# Patient Record
Sex: Female | Born: 1935 | Race: White | Hispanic: No | Smoking: Never smoker
Health system: Southern US, Community
[De-identification: ages and names within clinical notes are randomized; demographics above are authoritative.]

## PROBLEM LIST (undated history)

## (undated) DIAGNOSIS — E119 Type 2 diabetes mellitus without complications: Secondary | ICD-10-CM

## (undated) DIAGNOSIS — K635 Polyp of colon: Secondary | ICD-10-CM

## (undated) DIAGNOSIS — I10 Essential (primary) hypertension: Secondary | ICD-10-CM

## (undated) DIAGNOSIS — K649 Unspecified hemorrhoids: Secondary | ICD-10-CM

## (undated) DIAGNOSIS — G4700 Insomnia, unspecified: Secondary | ICD-10-CM

## (undated) DIAGNOSIS — E785 Hyperlipidemia, unspecified: Secondary | ICD-10-CM

## (undated) HISTORY — PX: TOTAL ANKLE ARTHROPLASTY: SHX811

## (undated) HISTORY — DX: Hyperlipidemia, unspecified: E78.5

## (undated) HISTORY — DX: Essential (primary) hypertension: I10

## (undated) HISTORY — DX: Unspecified hemorrhoids: K64.9

## (undated) HISTORY — DX: Polyp of colon: K63.5

## (undated) HISTORY — PX: APPENDECTOMY: SHX54

## (undated) HISTORY — DX: Type 2 diabetes mellitus without complications: E11.9

## (undated) HISTORY — DX: Insomnia, unspecified: G47.00

## (undated) HISTORY — PX: LUNG SURGERY: SHX703

## (undated) HISTORY — PX: TONSILLECTOMY: SUR1361

## (undated) HISTORY — PX: CHOLECYSTECTOMY: SHX55

---

## 2012-05-13 HISTORY — PX: COLONOSCOPY: SHX174

## 2013-03-30 ENCOUNTER — Ambulatory Visit: Payer: Self-pay

## 2013-05-24 ENCOUNTER — Ambulatory Visit: Payer: Self-pay | Admitting: Gastroenterology

## 2013-07-12 ENCOUNTER — Ambulatory Visit: Payer: Self-pay | Admitting: Physical Medicine and Rehabilitation

## 2013-09-15 ENCOUNTER — Emergency Department: Payer: Self-pay | Admitting: Emergency Medicine

## 2014-08-25 ENCOUNTER — Ambulatory Visit
Admit: 2014-08-25 | Disposition: A | Discharge status: Home / Self Care | Payer: Self-pay | Attending: Infectious Diseases | Admitting: Infectious Diseases

## 2015-01-27 ENCOUNTER — Other Ambulatory Visit: Payer: Self-pay | Admitting: Infectious Diseases

## 2015-01-27 DIAGNOSIS — Z1231 Encounter for screening mammogram for malignant neoplasm of breast: Secondary | ICD-10-CM

## 2015-01-30 ENCOUNTER — Ambulatory Visit
Admission: RE | Admit: 2015-01-30 | Discharge: 2015-01-30 | Disposition: A | Discharge status: Home / Self Care | Payer: Medicare PPO | Source: Ambulatory Visit | Attending: Infectious Diseases | Admitting: Infectious Diseases

## 2015-01-30 ENCOUNTER — Other Ambulatory Visit: Payer: Self-pay | Admitting: Infectious Diseases

## 2015-01-30 DIAGNOSIS — Z1231 Encounter for screening mammogram for malignant neoplasm of breast: Secondary | ICD-10-CM | POA: Insufficient documentation

## 2015-04-21 ENCOUNTER — Encounter: Payer: Medicare PPO | Attending: Infectious Diseases | Admitting: *Deleted

## 2015-04-21 ENCOUNTER — Encounter: Payer: Self-pay | Admitting: *Deleted

## 2015-04-21 VITALS — BP 114/80 | Ht 63.0 in | Wt 151.4 lb

## 2015-04-21 DIAGNOSIS — E119 Type 2 diabetes mellitus without complications: Secondary | ICD-10-CM | POA: Diagnosis not present

## 2015-04-21 NOTE — Progress Notes (Signed)
Diabetes Self-Management Education  Visit Type: First/Initial  Appt. Start Time: 1110 Appt. End Time: 1230  04/21/2015  [name], identified by name and date of birth, is a 79 y.o. female with a diagnosis of Diabetes: Type 2.   ASSESSMENT  Blood pressure 114/80, height 5\' 3"  (1.6 m), weight 151 lb 6.4 oz (68.675 kg). Body mass index is 26.83 kg/(m^2).      Diabetes Self-Management Education - 04/21/15 1424    Visit Information   Visit Type First/Initial   Initial Visit   Diabetes Type Type 2   Are you currently following a meal plan? No   Are you taking your medications as prescribed? Yes   Date Diagnosed last month   Health Coping   How would you rate your overall health? Good   Psychosocial Assessment   Patient Belief/Attitude about Diabetes Other (comment)  "No emotions"   Self-care barriers None   Self-management support Doctor's office;Family   Patient Concerns Nutrition/Meal planning;Monitoring;Glycemic Control;Problem Solving;Healthy Lifestyle;Weight Control   Special Needs None   Preferred Learning Style Auditory;Visual;Hands on   Learning Readiness Ready   How often do you need to have someone help you when you read instructions, pamphlets, or other written materials from your doctor or pharmacy? 1 - Never   What is the last grade level you completed in school? 12th   Complications   Last HgB A1C per patient/outside source 6.6 %  03/23/15   How often do you check your blood sugar? 0 times/day (not testing)  Provided One Touch Ultra Mini meter and instructed on use. BG upon return demonstration was 115 mg/dL at 95:6212:20 pm - 3 hrs pp.    Have you had a dilated eye exam in the past 12 months? No   Have you had a dental exam in the past 12 months? Yes   Are you checking your feet? No   Dietary Intake   Breakfast cereal and milk   Lunch fast food, weight watchers pizza   Dinner take home from lunch   Beverage(s) water, diet soda   Exercise   Exercise  Type Moderate (swimming / aerobic walking)   How many days per week to you exercise? 5   How many minutes per day do you exercise? 60   Total minutes per week of exercise 300   Patient Education   Previous Diabetes Education No   Disease state  Definition of diabetes, type 1 and 2, and the diagnosis of diabetes;Factors that contribute to the development of diabetes   Nutrition management  Role of diet in the treatment of diabetes and the relationship between the three main macronutrients and blood glucose level;Carbohydrate counting   Physical activity and exercise  Role of exercise on diabetes management, blood pressure control and cardiac health.   Monitoring Taught/evaluated SMBG meter.;Purpose and frequency of SMBG.;Identified appropriate SMBG and/or A1C goals.   Chronic complications Relationship between chronic complications and blood glucose control   Psychosocial adjustment Role of stress on diabetes   Individualized Goals (developed by patient)   Reducing Risk Improve blood sugars Prevent diabetes complications Lose weight Lead a healthier lifestyle Become more fit   Outcomes   Expected Outcomes Demonstrated interest in learning. Expect positive outcomes      Individualized Plan for Diabetes Self-Management Training:   Learning Objective:  Patient will have a greater understanding of diabetes self-management. Patient education plan is to attend individual and/or group sessions per assessed needs and concerns.   Plan:   Patient  Instructions  Check blood sugars 2 x day before breakfast and 2 hrs after supper 3 x week Exercise: Continue program for  60  minutes  5  days a week Eat 3 meals day,  1 snack a day Space meals 4-6 hours apart Don't skip meals Limit desserts/sweets Bring blood sugar records to the next class Call your doctor for a prescription for:  1. Meter strips (type) One Touch Ultra Blue checking  3  times per week  2. Lancets (type) One Touch  Delica checking  3  times per week  Expected Outcomes:  Demonstrated interest in learning. Expect positive outcomes  Education material provided:  General Meal Planning Guidelines Simple Meal Plan Meter - One Touch Ultra Mini  If problems or questions, patient to contact team via:   Sharion Settler, RN, CCM, CDE [phone]  Future DSME appointment:  Thursday May 11, 2015 for Class 1

## 2015-04-21 NOTE — Patient Instructions (Addendum)
Check blood sugars 2 x day before breakfast and 2 hrs after supper 3 x week Exercise: Continue program for  60  minutes  5  days a week Eat 3 meals day,  1 snack a day Space meals 4-6 hours apart Don't skip meals Limit desserts/sweets Bring blood sugar records to the next class Call your doctor for a prescription for:  1. Meter strips (type) One Touch Ultra Blue checking  3  times per week  2. Lancets (type) One Touch Delica checking  3  times per week

## 2015-05-11 ENCOUNTER — Encounter: Payer: Medicare PPO | Admitting: Dietician

## 2015-05-11 ENCOUNTER — Encounter: Payer: Self-pay | Admitting: Dietician

## 2015-05-11 VITALS — Wt 151.0 lb

## 2015-05-11 DIAGNOSIS — E119 Type 2 diabetes mellitus without complications: Secondary | ICD-10-CM

## 2015-05-11 NOTE — Patient Instructions (Signed)
Appt. Start Time: 9:00am Appt. End Time: 12:00 pm  Class 1 Diabetes Overview - define DM; state own type of DM; identify functions of pancreas and insulin; define insulin deficiency vs insulin resistance  Psychosocial - identify DM as a source of stress; state the effects of stress on BG control; verbalize appropriate stress management techniques; identify personal stress issues   Nutritional Management - describe effects of food on blood glucose; identify sources of carbohydrate, protein and fat; verbalize the importance of balance meals in controlling blood glucose; identify meals as well balanced or not; estimate servings of carbohydrate from menus; use food labels to identify servings size, content of carbohydrate, fiber, protein, fat, saturated fat and sodium; recognize food sources of fat, saturated fat, trans fat, sodium and verbalize goals for intake; describe healthful appropriate food choices when dining out   Exercise - describe the effects of exercise on blood glucose and importance of regular exercise in controlling diabetes; state a plan for personal exercise; verbalize contraindications for exercise  Self-Monitoring - state importance of HBGM and demo procedure accurately; use HBGM results to effectively manage diabetes; identify importance of regular HbA1C testing and goals for results  Acute Complications/Sick Day Guidelines - recognize hyperglycemia and hypoglycemia with causes and effects; identify blood glucose results as high, low or in control; list steps in treating and preventing high and low blood glucose; state appropriate measure to manage blood glucose when ill (need for meds, HBGM plan, when to call physician, need for fluids)  Chronic Complications/Foot, Skin, Eye Dental Care - identify possible long-term complications of diabetes (retinopathy, neuropathy, nephropathy, cardiovascular disease, infections); explain steps in prevention and treatment of chronic complications;  state importance of daily self-foot exams; describe how to examine feet and what to look for; explain appropriate eye and dental care  Lifestyle Changes/Goals & Health/Community Resources - state benefits of making appropriate lifestyle changes; identify habits that need to change (meals, tobacco, alcohol); identify strategies to reduce risk factors for personal health; set goals for proper diabetes care; state need for and frequency of healthcare follow-up; describe appropriate community resources for good health (ADA, web sites, apps)   Pregnancy/Sexual Health - define gestational diabetes; state importance of good blood glucose control and birth control prior to pregnancy; state importance of good blood glucose control in preventing sexual problems (impotence, vaginal dryness, infections, loss of desire); state relationship of blood glucose control and pregnancy outcome; describe risk of maternal and fetal complications  Teaching Materials Used: Class 1 Slides/Notebook Diabetes Booklet ID Card  Medic Alert/Medic ID Forms Sleep Evaluation Exercise Handout Daily Food Record Planning a Balanced Meal Goals for Class 1 

## 2015-05-18 ENCOUNTER — Encounter: Payer: Medicare Other | Attending: Infectious Diseases | Admitting: *Deleted

## 2015-05-18 ENCOUNTER — Encounter: Payer: Self-pay | Admitting: *Deleted

## 2015-05-18 VITALS — Wt 150.4 lb

## 2015-05-18 DIAGNOSIS — E119 Type 2 diabetes mellitus without complications: Secondary | ICD-10-CM | POA: Insufficient documentation

## 2015-05-18 NOTE — Progress Notes (Signed)

## 2015-05-25 ENCOUNTER — Encounter: Payer: Medicare Other | Admitting: Dietician

## 2015-05-25 ENCOUNTER — Encounter: Payer: Self-pay | Admitting: Dietician

## 2015-05-25 VITALS — BP 126/74 | Ht 63.0 in | Wt 151.0 lb

## 2015-05-25 DIAGNOSIS — E119 Type 2 diabetes mellitus without complications: Secondary | ICD-10-CM

## 2015-05-25 NOTE — Progress Notes (Signed)

## 2015-06-23 ENCOUNTER — Encounter: Payer: Self-pay | Admitting: *Deleted

## 2016-02-02 ENCOUNTER — Other Ambulatory Visit: Payer: Self-pay | Admitting: Physical Medicine and Rehabilitation

## 2016-02-02 DIAGNOSIS — M5416 Radiculopathy, lumbar region: Secondary | ICD-10-CM

## 2016-02-13 ENCOUNTER — Ambulatory Visit
Admission: RE | Admit: 2016-02-13 | Discharge: 2016-02-13 | Disposition: A | Discharge status: Home / Self Care | Payer: Medicare Other | Source: Ambulatory Visit | Attending: Physical Medicine and Rehabilitation | Admitting: Physical Medicine and Rehabilitation

## 2016-02-13 DIAGNOSIS — M5416 Radiculopathy, lumbar region: Secondary | ICD-10-CM | POA: Insufficient documentation

## 2016-02-13 DIAGNOSIS — M48061 Spinal stenosis, lumbar region without neurogenic claudication: Secondary | ICD-10-CM | POA: Diagnosis not present

## 2016-02-13 DIAGNOSIS — M47896 Other spondylosis, lumbar region: Secondary | ICD-10-CM | POA: Diagnosis not present

## 2016-02-13 DIAGNOSIS — M5126 Other intervertebral disc displacement, lumbar region: Secondary | ICD-10-CM | POA: Insufficient documentation

## 2017-06-25 ENCOUNTER — Telehealth: Payer: Self-pay | Admitting: General Surgery

## 2017-06-25 ENCOUNTER — Encounter: Payer: Self-pay | Admitting: General Surgery

## 2017-06-25 NOTE — Telephone Encounter (Signed)
I HAVE CALLED & L/M FOR PATIENT TO CALL & SCHEDULE AN APPOINTMENT WITH DR BYRNETT FOR RECTAL PAIN & HEMORRHOIDS,REF'D BY DR DAVID Sampson GoonFITZGERALD.

## 2017-07-09 ENCOUNTER — Encounter: Payer: Self-pay | Admitting: *Deleted

## 2017-07-15 ENCOUNTER — Ambulatory Visit: Payer: Medicare Other | Admitting: General Surgery

## 2017-07-15 ENCOUNTER — Encounter: Payer: Self-pay | Admitting: General Surgery

## 2017-07-15 VITALS — BP 120/78 | HR 108 | Resp 16 | Ht 61.0 in | Wt 143.0 lb

## 2017-07-15 DIAGNOSIS — K6289 Other specified diseases of anus and rectum: Secondary | ICD-10-CM

## 2017-07-15 NOTE — Patient Instructions (Signed)
Encouraged daily use of Fiber supplements and stool softeners.

## 2017-07-15 NOTE — Progress Notes (Signed)
Dictation #1 [id number]  [id number] Patient ID: Yvette Elliott, female   DOB: 1936-03-04, 82 y.o.   MRN: [id number]  Chief Complaint  Patient presents with  . Rectal Problems    HPI Yvette Elliott is a 82 y.o. female.  Here today for evaluation of hemorrhoids and constipation referred by Dr Sampson Goon. Bowels move daily with the use of metamucil and stool softener. She has always been prone to constipation and has even tried linzess in the past. She states she does not have rectal pain until she has a BM then she has pain the rest of the day. She does admit to having rectal pain later in the day even without a BM.  The patient had made use of Linzess for constipation.  She reported this produced unacceptable loose stools with urgency, even when decreased to an every other day dose.    The patient has been making use of a fiber supplement for the last 6 weeks without any significant improvement in her perianal/perineal pain, but fairly significant improvement in the regularity of her bowel function.  When she gets the urge to have a BM she still has to strain. No abdominal pain and no bleeding.   She did see Dr Malissa Hippo and was not pleased.  Son is in [place] coming off a vent after open heart surgery for leaking valve.  HPI  Past Medical History:  Diagnosis Date  . Colon polyp   . Diabetes mellitus without complication (HCC)   . Hemorrhoids   . Hyperlipidemia   . Hypertension   . Insomnia     Past Surgical History:  Procedure Laterality Date  . APPENDECTOMY    . CHOLECYSTECTOMY    . COLONOSCOPY  2014  . LUNG SURGERY     Removal of scar tissue  . TONSILLECTOMY    . TOTAL ANKLE ARTHROPLASTY Right     Family History  Problem Relation Age of Onset  . Leukemia Sister 81  . Diabetes Sister   . Diabetes Mother   . Diabetes Father   . Diabetes Brother   . Diabetes Maternal Grandfather   . Diabetes Paternal Grandmother   . Diabetes Brother   . Breast cancer  Maternal Grandmother 29    Social History Social History   Tobacco Use  . Smoking status: Never Smoker  . Smokeless tobacco: Never Used  Substance Use Topics  . Alcohol use: No    Alcohol/week: 0.0 oz  . Drug use: No    Allergies  Allergen Reactions  . Gemfibrozil     Questionable  . Iodine Hives and Itching    IVP dye  . Tylenol [Acetaminophen]     Issue with liver  . Aleve [Naproxen Sodium] Rash  . Ibuprofen Rash    States she can take advil    Current Outpatient Medications  Medication Sig Dispense Refill  . docusate sodium (COLACE) 100 MG capsule Take 100 mg by mouth daily as needed for mild constipation.    . hydrochlorothiazide (HYDRODIURIL) 12.5 MG tablet TAKE ONE TABLET BY MOUTH THREE TIMES A WEEK    . lisinopril (PRINIVIL,ZESTRIL) 40 MG tablet Take 40 mg by mouth daily.    Marland Kitchen LORazepam (ATIVAN) 0.5 MG tablet Take 0.5 mg by mouth daily as needed.    . Multiple Vitamin (MULTI-VITAMINS) TABS Take 1 tablet by mouth daily.    Marland Kitchen omeprazole (PRILOSEC) 20 MG capsule Take 20 mg by mouth daily as needed.    . psyllium (METAMUCIL) 58.6 %  packet Take 1 packet by mouth daily.    . rosuvastatin (CRESTOR) 10 MG tablet Take 10 mg by mouth daily.    Marland Kitchen. zolpidem (AMBIEN CR) 12.5 MG CR tablet Take 12.5 mg by mouth at bedtime as needed. for sleep  3   No current facility-administered medications for this visit.     Review of Systems Review of Systems  Constitutional: Negative.   Respiratory: Negative.   Cardiovascular: Negative.     Blood pressure 120/78, pulse (!) 108, resp. rate 16, height 5\' 1"  (1.549 m), weight 143 lb (64.9 kg), SpO2 96 %.  Physical Exam Physical Exam  Constitutional: She is oriented to person, place, and time. She appears well-developed and well-nourished.  HENT:  Mouth/Throat: Oropharynx is clear and moist.  Eyes: Conjunctivae are normal. No scleral icterus.  Neck: Neck supple.  Cardiovascular: Normal rate, regular rhythm and normal heart sounds.   Pulmonary/Chest: Effort normal and breath sounds normal.  Genitourinary: Rectal exam shows no external hemorrhoid and no fissure.  Lymphadenopathy:    She has no cervical adenopathy.  Neurological: She is alert and oriented to person, place, and time.  Skin: Skin is warm and dry.  Psychiatric: Her behavior is normal.    Data Reviewed Anoscopy showed minimal internal hemorrhoids.  No prolapse.  No bleeding.  No ulceration.  No visualized lower rectal lesions.  Sphincter tone was normal.  No palpable masses in the tissue adjacent to the rectum.  No particular spasm of the pelvic floor musculature noted.  Colonoscopy of May 24, 2013 completed by Dow AdolphMatthew Rein, MD reviewed.  Normal study.  Recommended no additional follow-ups in spite of patient's family history of colon cancer.  Did described nonthrombosed external hemorrhoids at the time of that exam.  PCP notes of June 25, 2017 reviewed.  June 02, 2017 general surgery assessment by Renda RollsWilton Smith, MD reviewed.  Assessment    No clear-cut explanation for the patient's report of discomfort in the pelvis, especially in the perianal area in the evening.  No evidence of internal or external hemorrhoids, anal fissure or fistula.    Plan    Encouraged daily use of Fiber supplements and stool softeners.  The case was reviewed with the GYN service after the patient had left the office.  Recommendation for referral to the physical therapy department for assessment of pelvic floor pathology.        HPI, Physical Exam, Assessment and Plan have been scribed under the direction and in the presence of Earline MayotteJeffrey W. Sayre Mazor, MD. Dorathy DaftMarsha Hatch, RN  I have completed the exam and reviewed the above documentation for accuracy and completeness.  I agree with the above.  [employer] has been used and any errors in dictation or transcription are unintentional.  Donnalee CurryJeffrey Amy Gothard, M.D., F.A.C.S.   Merrily PewJeffrey W Taria Castrillo 07/16/2017, 8:09  PM

## 2017-07-16 DIAGNOSIS — K6289 Other specified diseases of anus and rectum: Secondary | ICD-10-CM | POA: Insufficient documentation

## 2017-07-17 ENCOUNTER — Telehealth: Payer: Self-pay | Admitting: General Surgery

## 2017-07-17 ENCOUNTER — Other Ambulatory Visit: Payer: Self-pay

## 2017-07-17 DIAGNOSIS — R102 Pelvic and perineal pain: Secondary | ICD-10-CM

## 2017-07-17 NOTE — Telephone Encounter (Signed)
The patient's symptoms and exam had been presented to the GYN service for their input.  Recommendation to consider referral to physical therapy for pelvic floor exercises.  This information was relayed to the patient.  She is amenable to meet with the physical therapy staff for an initial assessment.

## 2017-07-20 ENCOUNTER — Encounter: Payer: Self-pay | Admitting: General Surgery

## 2017-08-08 ENCOUNTER — Encounter: Payer: Self-pay | Admitting: Physical Therapy

## 2017-08-08 ENCOUNTER — Ambulatory Visit: Payer: Medicare Other | Attending: General Surgery | Admitting: Physical Therapy

## 2017-08-08 DIAGNOSIS — M533 Sacrococcygeal disorders, not elsewhere classified: Secondary | ICD-10-CM | POA: Insufficient documentation

## 2017-08-08 DIAGNOSIS — M6281 Muscle weakness (generalized): Secondary | ICD-10-CM | POA: Insufficient documentation

## 2017-08-08 DIAGNOSIS — M25571 Pain in right ankle and joints of right foot: Secondary | ICD-10-CM | POA: Diagnosis present

## 2017-08-08 DIAGNOSIS — M79601 Pain in right arm: Secondary | ICD-10-CM | POA: Diagnosis present

## 2017-08-08 DIAGNOSIS — R2689 Other abnormalities of gait and mobility: Secondary | ICD-10-CM | POA: Diagnosis present

## 2017-08-08 DIAGNOSIS — M2170 Unequal limb length (acquired), unspecified site: Secondary | ICD-10-CM | POA: Diagnosis present
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
## 2017-08-11 NOTE — Patient Instructions (Signed)
Wear shoe lift

## 2017-08-11 NOTE — Addendum Note (Signed)
Addended by: Mariane MastersYEUNG, SHIN-YIING on: 08/11/2017 12:50 PM   Modules accepted: Orders

## 2017-08-12 ENCOUNTER — Ambulatory Visit: Payer: Medicare Other | Attending: General Surgery | Admitting: Physical Therapy

## 2017-08-12 DIAGNOSIS — R2689 Other abnormalities of gait and mobility: Secondary | ICD-10-CM | POA: Insufficient documentation

## 2017-08-12 DIAGNOSIS — M79601 Pain in right arm: Secondary | ICD-10-CM

## 2017-08-12 DIAGNOSIS — M2170 Unequal limb length (acquired), unspecified site: Secondary | ICD-10-CM | POA: Insufficient documentation

## 2017-08-12 DIAGNOSIS — M25571 Pain in right ankle and joints of right foot: Secondary | ICD-10-CM | POA: Diagnosis present

## 2017-08-12 DIAGNOSIS — M533 Sacrococcygeal disorders, not elsewhere classified: Secondary | ICD-10-CM | POA: Insufficient documentation

## 2017-08-12 DIAGNOSIS — M6281 Muscle weakness (generalized): Secondary | ICD-10-CM | POA: Diagnosis present

## 2017-08-18 ENCOUNTER — Other Ambulatory Visit: Payer: Self-pay | Admitting: Physical Medicine and Rehabilitation

## 2017-08-18 DIAGNOSIS — M5416 Radiculopathy, lumbar region: Secondary | ICD-10-CM

## 2017-08-19 ENCOUNTER — Ambulatory Visit: Payer: Medicare Other | Admitting: Physical Therapy

## 2017-08-19 ENCOUNTER — Ambulatory Visit
Admission: RE | Admit: 2017-08-19 | Discharge: 2017-08-19 | Disposition: A | Discharge status: Home / Self Care | Payer: Medicare Other | Source: Ambulatory Visit | Attending: Physical Medicine and Rehabilitation | Admitting: Physical Medicine and Rehabilitation

## 2017-08-19 DIAGNOSIS — M48061 Spinal stenosis, lumbar region without neurogenic claudication: Secondary | ICD-10-CM | POA: Diagnosis not present

## 2017-08-19 DIAGNOSIS — M47816 Spondylosis without myelopathy or radiculopathy, lumbar region: Secondary | ICD-10-CM | POA: Diagnosis not present

## 2017-08-19 DIAGNOSIS — M5416 Radiculopathy, lumbar region: Secondary | ICD-10-CM

## 2017-08-19 DIAGNOSIS — M419 Scoliosis, unspecified: Secondary | ICD-10-CM | POA: Diagnosis not present

## 2017-08-20 ENCOUNTER — Other Ambulatory Visit: Payer: Self-pay | Admitting: Physical Medicine and Rehabilitation

## 2017-08-20 DIAGNOSIS — M5412 Radiculopathy, cervical region: Secondary | ICD-10-CM

## 2017-08-21 ENCOUNTER — Telehealth: Payer: Self-pay | Admitting: Physical Therapy

## 2017-08-21 NOTE — Patient Instructions (Addendum)
Open book  Alternating freestyle with back stroke/ breast stroke to stabilize shoulder   Body scan / relaxation training to decrease upper shoulders and improve forward head posture  Education about pain science    Sleeping with bottom shoulder further back in line with other shoulder ( less fetal position), pillow between legs.

## 2017-08-21 NOTE — Telephone Encounter (Signed)
PT called pt to check in with her cancellation of future appts. Pt stated she has followed the recommendations from her past 2 visits ( wearing of shoe lift to account for scoliosis/leg length difference, changing her sleeping pillows). Pt is seeking care with Dr. Yves Dillhasnis ( Pyschiatrist)   to help with nerve pain. Pt felt overwhelmed with her medical issues and needed to simplify her Tx. PT explained her customized POC and how PT can be helpful in combination with Dr. Margaretann Lovelesshasnis's treatments and to call back to schedule more PT when she is ready to resume. Pt voiced understanding and expressed interest.  PT discussed with pt that PT will email her scheduling appt phone # and that PT plans to communicate with Dr. Yves Dillhasnis.    PT left message with Dr. Yves Dillhasnis to provide recommendation when pt would be appropriate to resume PT.

## 2017-08-21 NOTE — Therapy (Addendum)
[facility] MAIN [facility] SERVICES [address] [place], [place], [address] Phone: [phone]   Fax:  [phone]  Physical Therapy Treatment  Patient Details  Name: Yvette Elliott MRN: [id number] Date of Birth: [birth year] Referring Provider: Lemar Livings   Encounter Date: 08/12/2017    Past Medical History:  Diagnosis Date  . Colon polyp   . Diabetes mellitus without complication (HCC)   . Hemorrhoids   . Hyperlipidemia   . Hypertension   . Insomnia     Past Surgical History:  Procedure Laterality Date  . APPENDECTOMY    . CHOLECYSTECTOMY    . COLONOSCOPY  2014  . LUNG SURGERY     Removal of scar tissue  . TONSILLECTOMY    . TOTAL ANKLE ARTHROPLASTY Right     There were no vitals filed for this visit.  Subjective Assessment - 08/21/17 1102    Subjective  Pt reports she was able to walk more stable with her dogs and did not need to use her cane    Pertinent History  See above.  Pt states all of these pain in different body parts have taken her down psychologically. Pt is able to do everything she wants to do physically but she has to push herself.  Pt is no longer wanting to do her housework because of her mindset.  Pt feels frustrated with doctors and had to get second opinions about her rectal pain.  The second doctor confirmed that it is not hemorrhoids and verified it was a pelvic floor issue. Pt currently has dental issues and has been passed along different dentist.           [facility] PT Assessment - 08/21/17 1107      Palpation   SI assessment   iliac crest levelled     Palpation comment  increased paraspinal mm tensions at thoracic /medial scap area , R scalenes, and  above R lumbar convex curve                    [facility] Adult PT Treatment/Exercise - 08/21/17 1110      Neuro Re-ed    Neuro Re-ed Details   see pt instructions, relaxation training      Manual Therapy   Manual therapy comments  STM and MWM w/ trunk  rotation open book to release thoracic paraspinal, upper trap, medial aspect of shoulders              PT Education - 08/21/17 1113    Education provided  Yes    Education Details  HEP    Person(s) Educated  Patient    Methods  Explanation;Demonstration;Tactile cues;Verbal cues;Handout    Comprehension  Returned demonstration;Verbalized understanding          PT Long Term Goals - 08/11/17 1223      PT LONG TERM GOAL #1   Title  Pt will decrease her Quick Dash score from 28% to < 18% in order to restore function of her R arm and  to sleep on her R side     Time  8    Period  Weeks    Status  New    Target Date  10/06/17      PT LONG TERM GOAL #2   Title  Pt will increase her PSFS score for sleeping from 2/10 to > 8/10 in order to sleep and improve QOL    Time  4    Period  Weeks  Status  New    Target Date  10/06/17      PT LONG TERM GOAL #3   Title  Pt will decrease her LEFS score from 60/79 pts ( 76pt% ) to > 75 / 79 pts (  94% )   in order to walk a mile and do household chores.     Time  12    Period  Weeks    Status  New    Target Date  11/03/17      PT LONG TERM GOAL #4   Title  Pt will demo increased gait speed from 0.7 m/s to 1.3 m/s with less heel striking in order to progress towards walking longer distances    Time  6    Period  Weeks    Status  New    Target Date  09/22/17      PT LONG TERM GOAL #5   Title  Pt will demo IND with thoracolumbar strengthening HEP to minimize hyperflexibility of shoulder girdle as a swimmer and to minimize risk for injuries    Time  10    Period  Weeks    Status  New    Target Date  10/20/17            Plan - 08/21/17 1114    Clinical Impression Statement Pt showed good carry over with a more levelled pelvic alignment and smoother gait pattern with shoe lift in L shoe to account for leg length difference. Addressed scoliosis with manual Tx and HEP to improve forward head/overuse of upper trap/ increase  scapular stabilization to help pt achieve goal to be able to sleep on R side with less R arm pain.  Focused on sleeping modifications today in order to optimize sleep quality for improved healing process.  Planning future Tx to address scoliosis further will help with rectal/ CLBP pain/ UE complaints.   Pt requires biopsychosocial approach as she feels frustrated about her pain occurring all different parts. Initiated pain science education and mindfulness/ relaxation training.   Pt continues to perform her fitness exercises with regular routine of swimming , yoga, walking. Educated pt to perform less freestyle stroke (which she performs with repeated R sided head turns for breathing) and to alternative more with back stroke to stabilize her scapula and minimize excessive turn to her head.  No changes to numbness / tingling in her hand despite neural glides. Pt plans to communicate with doctor for neck imaging to create appropriate plan of Tx for neck/shoulders in order to help improve quality of sleep by being able to sleep on her R side. Pt continues to benefit from skilled PT.    Rehab Potential  Good    PT Frequency  1x / week    PT Duration  12 weeks    PT Treatment/Interventions  Neuromuscular re-education;Gait training;Stair training;Functional mobility training;Therapeutic activities;Therapeutic exercise;Manual techniques;Patient/family education;Scar mobilization;Moist Heat;Aquatic Therapy;Taping;Orthotic Fit/Training    Consulted and Agree with Plan of Care  Patient       Patient will benefit from skilled therapeutic intervention in order to improve the following deficits and impairments:  Abnormal gait, Decreased endurance, Decreased scar mobility, Hypomobility, Decreased activity tolerance, Increased fascial restricitons, Decreased strength, Decreased mobility, Difficulty walking, Increased muscle spasms, Decreased range of motion, Decreased safety awareness, Decreased coordination, Postural  dysfunction, Improper body mechanics, Pain  Visit Diagnosis: Leg length difference, acquired  Other abnormalities of gait and mobility  Sacrococcygeal disorders, not elsewhere classified  Muscle weakness (generalized)  Pain in right ankle and joints of right foot  Pain in right arm     Problem List Patient Active Problem List   Diagnosis Date Noted  . Rectal pain 07/16/2017    Mariane MastersYeung,Shin Yiing ,PT, DPT, E-RYT  t 08/21/2017, 11:15 AM  [facility] [address] [place], [place], [address] Phone: [phone]   Fax:  [phone]  Name: Yvette Elliott MRN: [id number] Date of Birth: 12/27/1935

## 2017-08-26 ENCOUNTER — Ambulatory Visit: Payer: Medicare Other | Admitting: Physical Therapy

## 2017-08-27 ENCOUNTER — Ambulatory Visit
Admission: RE | Admit: 2017-08-27 | Discharge: 2017-08-27 | Disposition: A | Discharge status: Home / Self Care | Payer: Medicare Other | Source: Ambulatory Visit | Attending: Physical Medicine and Rehabilitation | Admitting: Physical Medicine and Rehabilitation

## 2017-08-27 DIAGNOSIS — M5412 Radiculopathy, cervical region: Secondary | ICD-10-CM | POA: Diagnosis present

## 2017-08-27 DIAGNOSIS — M4802 Spinal stenosis, cervical region: Secondary | ICD-10-CM | POA: Diagnosis not present

## 2017-08-27 DIAGNOSIS — M50122 Cervical disc disorder at C5-C6 level with radiculopathy: Secondary | ICD-10-CM | POA: Insufficient documentation

## 2017-08-27 DIAGNOSIS — M542 Cervicalgia: Secondary | ICD-10-CM | POA: Insufficient documentation

## 2017-09-02 ENCOUNTER — Encounter: Payer: Medicare Other | Admitting: Physical Therapy

## 2017-09-09 ENCOUNTER — Ambulatory Visit: Payer: Medicare Other | Admitting: Physical Therapy

## 2017-09-10 ENCOUNTER — Other Ambulatory Visit: Payer: Self-pay | Admitting: Physician Assistant

## 2017-09-10 ENCOUNTER — Ambulatory Visit
Admission: RE | Admit: 2017-09-10 | Discharge: 2017-09-10 | Disposition: A | Discharge status: Home / Self Care | Payer: Medicare Other | Source: Ambulatory Visit | Attending: Physician Assistant | Admitting: Physician Assistant

## 2017-09-10 DIAGNOSIS — J320 Chronic maxillary sinusitis: Secondary | ICD-10-CM | POA: Diagnosis not present

## 2017-09-10 DIAGNOSIS — R51 Headache: Principal | ICD-10-CM

## 2017-09-10 DIAGNOSIS — R519 Headache, unspecified: Secondary | ICD-10-CM

## 2018-05-27 ENCOUNTER — Ambulatory Visit: Payer: Self-pay | Admitting: Family Medicine

## 2018-07-29 IMAGING — CT CT MAXILLOFACIAL W/O CM
3 series · 14 of 47 positions shown, 16 images · non-contrast
Comparison: None.

CLINICAL DATA: Diagnosed with sinus infection. Pain over maxillary
sinuses.

EXAM:
CT MAXILLOFACIAL WITHOUT CONTRAST
TECHNIQUE: Multidetector CT imaging of the maxillofacial structures was
performed. Multiplanar CT image reconstructions were also generated.

[Series 3: ax soft · axial · 0.29mm/px · z∈[-142,-42]mm · 8 of 60 slices shown, 10 images]
[im 5/60  brain]
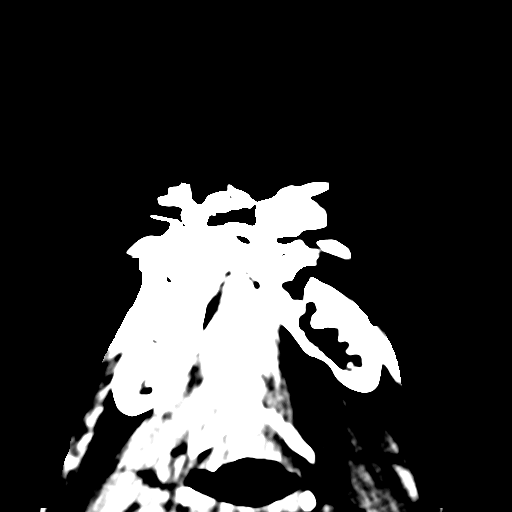
[im 5/60  bone]
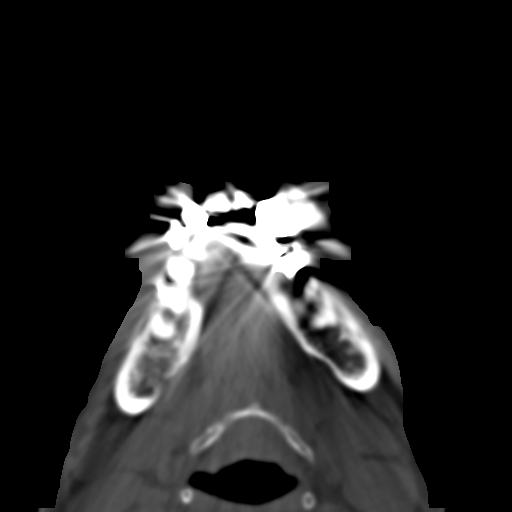
[im 13/60  bone]
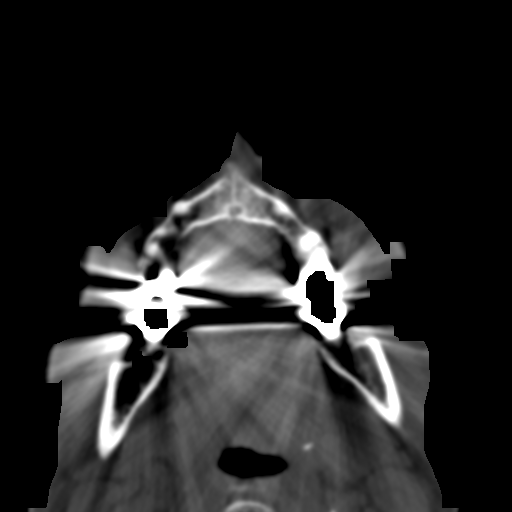
[im 19/60  bone]
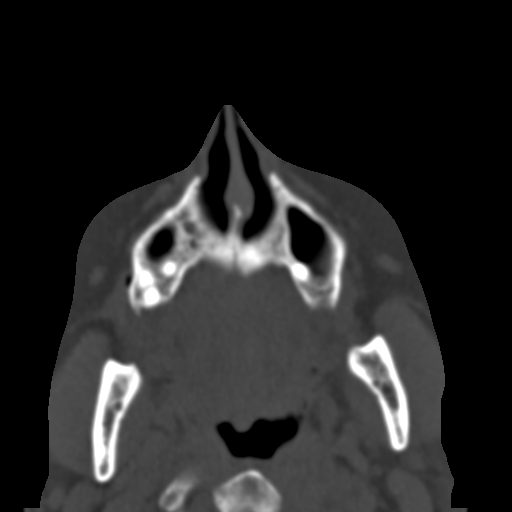
[im 27/60  bone]
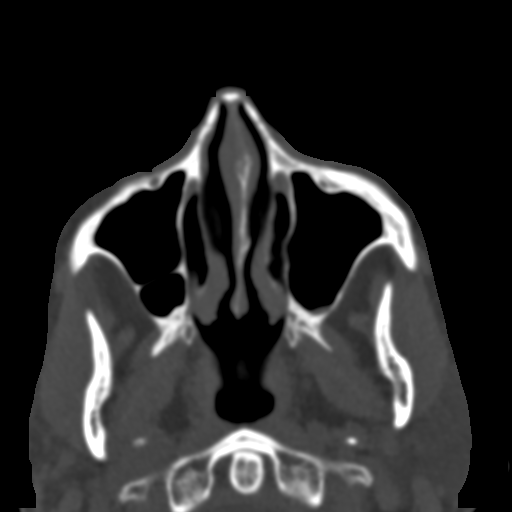
[im 33/60  brain]
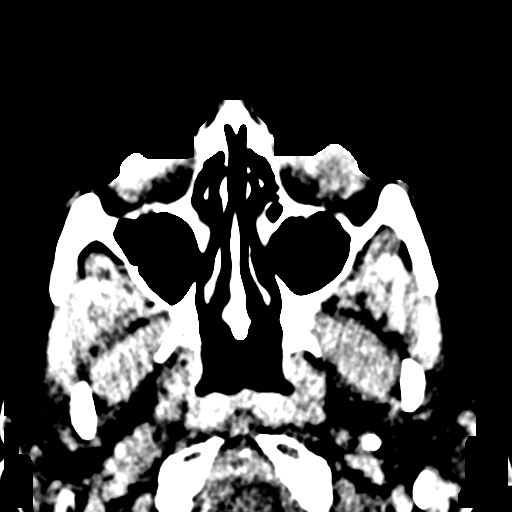
[im 33/60  bone]
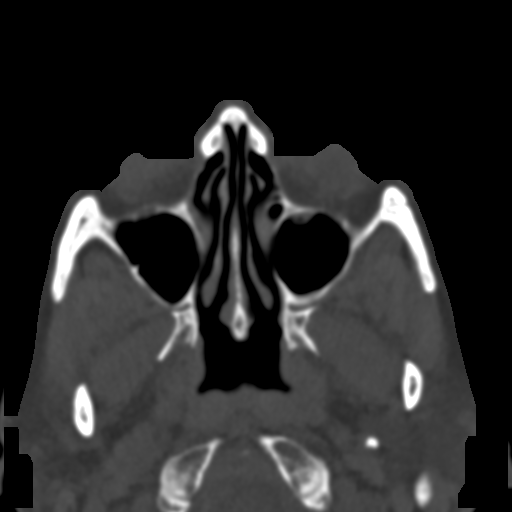
[im 41/60  bone]
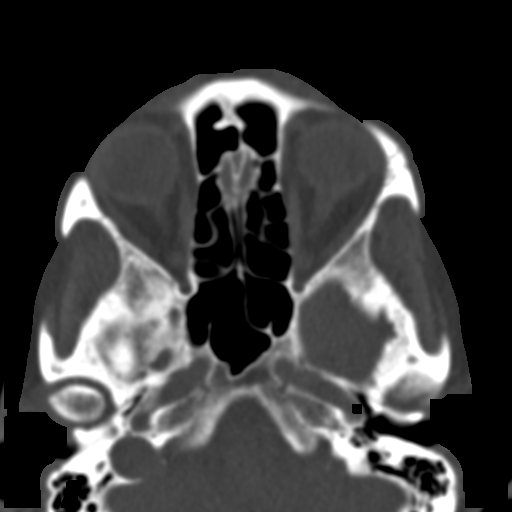
[im 47/60  bone]
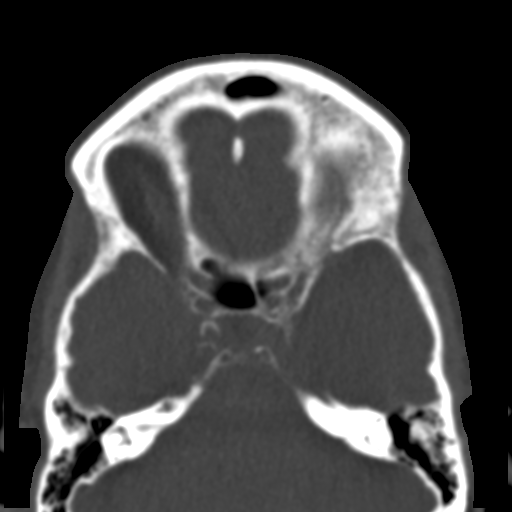
[im 55/60  bone]
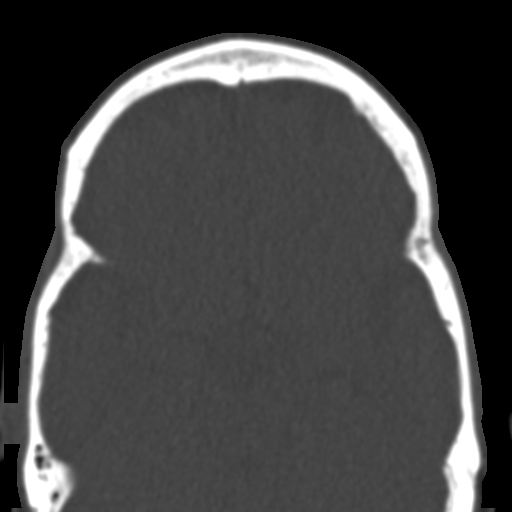

[Series 4: coronal bone · coronal · 0.26mm/px · 3 of 67 slices shown]
[im 23/67  bone]
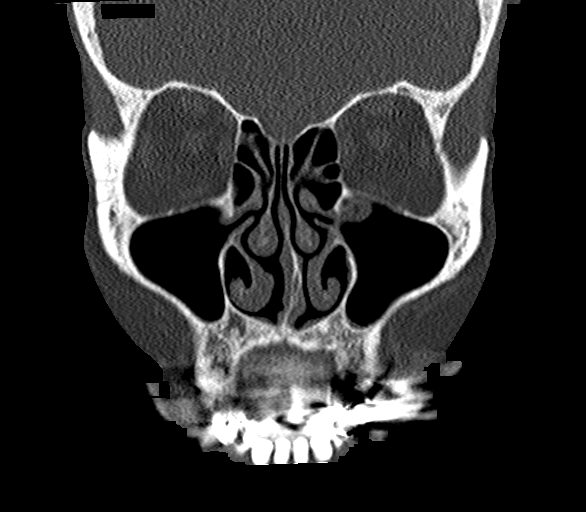
[im 30/67  bone]
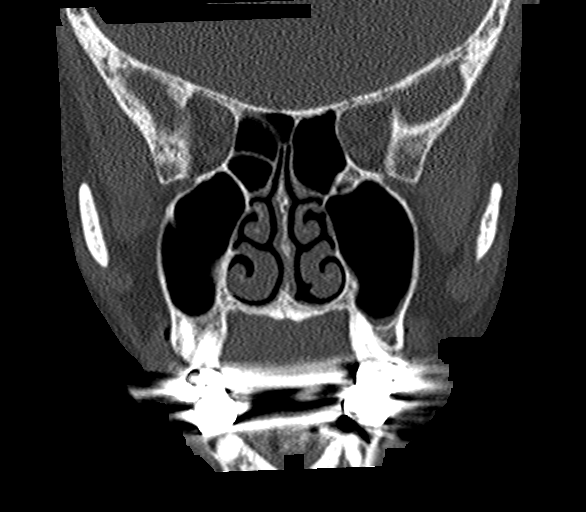
[im 37/67  bone]
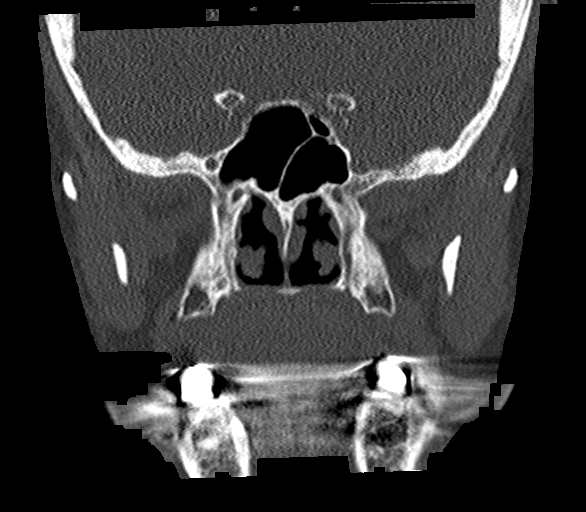

[Series 5: sagittal bone · sagittal · 0.25mm/px · 3 of 73 slices shown]
[im 25/73  bone]
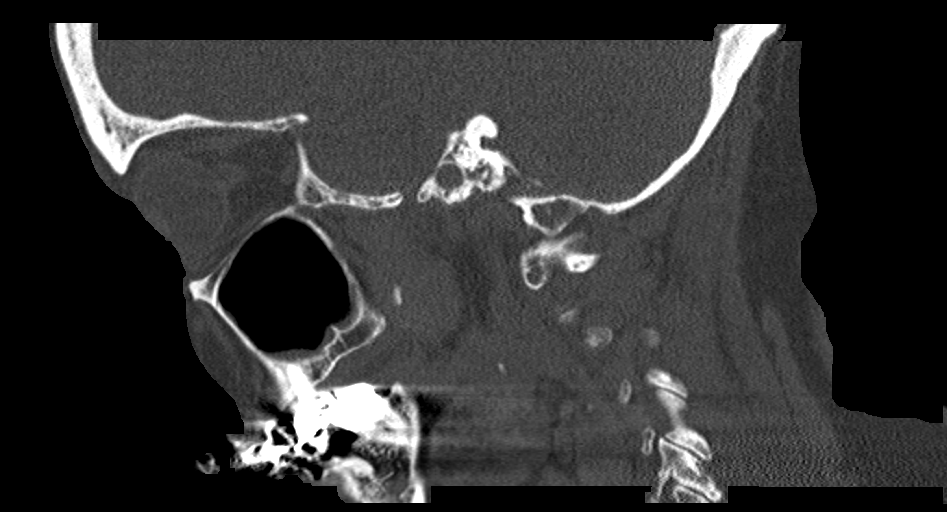
[im 37/73  bone]
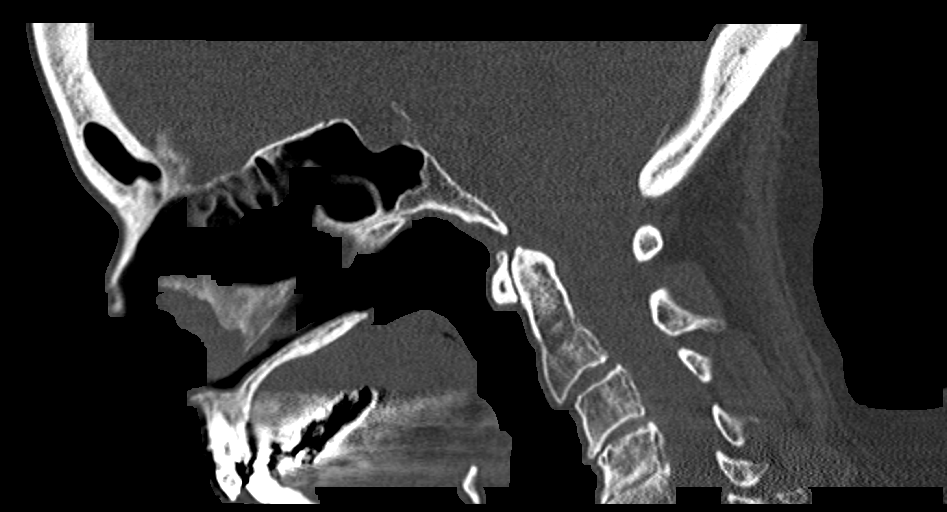
[im 49/73  bone]
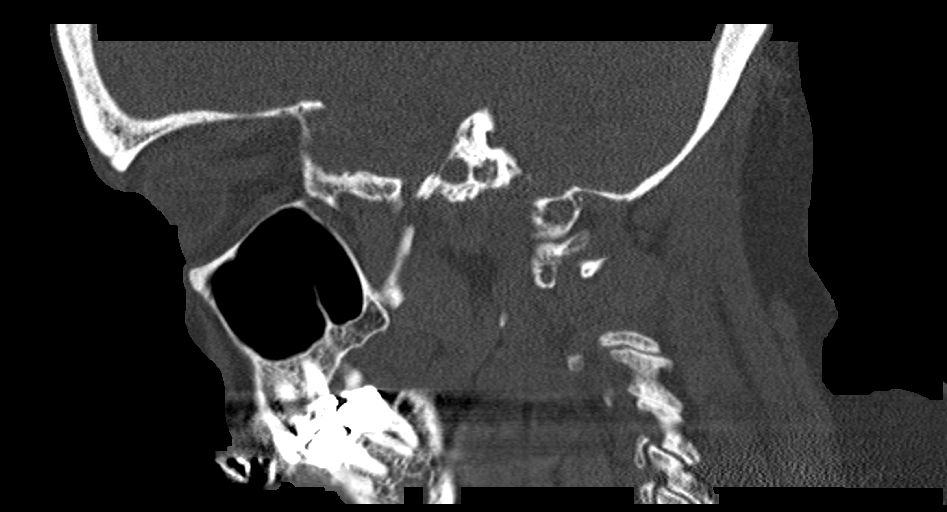

[14 of 47 positions shown; findings below may reference images not displayed]

FINDINGS: Osseous: No worrisome osseous lesion.  LEFT TMJ arthropathy.

Orbits: Negative.

Sinuses: No significant sinus opacity. Minor mucosal thickening in
the LEFT maxillary sinus.

Soft tissues: No significant facial soft tissue swelling or mass.

Limited intracranial: Normal for age cerebral volume. Mild
atherosclerotic change of the cavernous carotid arteries.
IMPRESSION: Minor LEFT maxillary sinus disease. No significant opacity, layering
fluid, expansion,

## 2019-05-27 ENCOUNTER — Ambulatory Visit: Payer: Medicare Other | Attending: Internal Medicine

## 2019-05-27 DIAGNOSIS — Z23 Encounter for immunization: Secondary | ICD-10-CM

## 2019-05-27 NOTE — Progress Notes (Signed)
   Covid-19 Vaccination Clinic  Name:  Yvette Elliott    MRN: [id number] DOB: May 07, 1936  05/27/2019  Yvette Elliott was observed post Covid-19 immunization for 15 minutes without incidence. She was provided with Vaccine Information Sheet and instruction to access the V-Safe system.   Yvette Elliott was instructed to call 911 with any severe reactions post vaccine: Marland Kitchen Difficulty breathing  . Swelling of your face and throat  . A fast heartbeat  . A bad rash all over your body  . Dizziness and weakness    Immunizations Administered    Name Date Dose VIS Date Route   Pfizer COVID-19 Vaccine 05/27/2019  9:44 AM 0.3 mL 04/23/2019 Intramuscular   Manufacturer: [employer], [employer]   Lot: [id number]   NDC: [address]-7

## 2019-06-14 ENCOUNTER — Ambulatory Visit: Payer: Medicare PPO | Attending: Internal Medicine

## 2019-06-14 ENCOUNTER — Ambulatory Visit: Payer: Medicare PPO

## 2019-06-14 DIAGNOSIS — Z23 Encounter for immunization: Secondary | ICD-10-CM | POA: Insufficient documentation

## 2019-06-14 NOTE — Progress Notes (Signed)
   Covid-19 Vaccination Clinic  Name:  Yvette Elliott    MRN: [id number] DOB: 10-29-1935  06/14/2019  Yvette Elliott was observed post Covid-19 immunization for 15 minutes without incidence. She was provided with Vaccine Information Sheet and instruction to access the V-Safe system.   Yvette Elliott was instructed to call 911 with any severe reactions post vaccine: Marland Kitchen Difficulty breathing  . Swelling of your face and throat  . A fast heartbeat  . A bad rash all over your body  . Dizziness and weakness    Immunizations Administered    Name Date Dose VIS Date Route   Pfizer COVID-19 Vaccine 06/14/2019 10:35 AM 0.3 mL 04/23/2019 Intramuscular   Manufacturer: [employer], [employer]   Lot: [id number]   NDC: [address]-3

## 2019-10-29 ENCOUNTER — Other Ambulatory Visit: Payer: Self-pay

## 2019-10-29 ENCOUNTER — Encounter: Payer: Self-pay | Admitting: *Deleted

## 2019-10-29 ENCOUNTER — Encounter: Payer: Medicare PPO | Attending: Internal Medicine | Admitting: *Deleted

## 2019-10-29 VITALS — BP 110/70 | Ht 61.0 in | Wt 145.6 lb

## 2019-10-29 DIAGNOSIS — Z7189 Other specified counseling: Secondary | ICD-10-CM | POA: Diagnosis not present

## 2019-10-29 DIAGNOSIS — E119 Type 2 diabetes mellitus without complications: Secondary | ICD-10-CM | POA: Insufficient documentation

## 2019-10-29 DIAGNOSIS — I10 Essential (primary) hypertension: Secondary | ICD-10-CM | POA: Insufficient documentation

## 2019-10-29 NOTE — Progress Notes (Signed)
Diabetes Self-Management Education  Visit Type: First/Initial  Appt. Start Time: 1105 Appt. End Time: 1215  10/29/2019  [name], identified by name and date of birth, is a 84 y.o. female with a diagnosis of Diabetes: Type 2.   ASSESSMENT  Blood pressure 110/70, height 5\' 1"  (1.549 m), weight 145 lb 9.6 oz (66 kg). Body mass index is 27.51 kg/m.   Diabetes Self-Management Education - 10/29/19 1228      Visit Information   Visit Type First/Initial      Initial Visit   Diabetes Type Type 2    Are you currently following a meal plan? Yes    What type of meal plan do you follow? "trying to eat healthier, trying to stay away from sweets"    Are you taking your medications as prescribed? Yes    Date Diagnosed 3 weeks      Health Coping   How would you rate your overall health? Good      Psychosocial Assessment   Patient Belief/Attitude about Diabetes Motivated to manage diabetes   "scared"   Self-care barriers None    Self-management support Doctor's office;Friends    Patient Concerns Nutrition/Meal planning;Glycemic Control;Monitoring;Weight Control;Healthy Lifestyle    Special Needs None    Preferred Learning Style Hands on;Auditory    Learning Readiness Change in progress    How often do you need to have someone help you when you read instructions, pamphlets, or other written materials from your doctor or pharmacy? 1 - Never    What is the last grade level you completed in school? some college      Pre-Education Assessment   Patient understands the diabetes disease and treatment process. Needs Review    Patient understands incorporating nutritional management into lifestyle. Needs Review    Patient undertands incorporating physical activity into lifestyle. Demonstrates understanding / competency    Patient understands using medications safely. Needs Review    Patient understands monitoring blood glucose, interpreting and using results Needs Review    Patient  understands prevention, detection, and treatment of acute complications. Needs Review    Patient understands prevention, detection, and treatment of chronic complications. Needs Review    Patient understands how to develop strategies to address psychosocial issues. Needs Review    Patient understands how to develop strategies to promote health/change behavior. Needs Review      Complications   Last HgB A1C per patient/outside source 6.9 %   10/05/2019   How often do you check your blood sugar? 0 times/day (not testing)   Provided One Touch Verio Flex and instructed on use. BG upon return demonstration was 98 mg/dL at 10/07/2019 noon - 2 1/2 hrs after eating a snack   Have you had a dilated eye exam in the past 12 months? Yes    Have you had a dental exam in the past 12 months? Yes    Are you checking your feet? Yes    How many days per week are you checking your feet? 7      Dietary Intake   Breakfast was skipping breakfast but now has cereal and almond milk; may switch to yogurt or oatmeal    Snack (morning) 0-1 snacks/day - cheese and crackers, egg    Lunch eats out with friends - (takes 1/2 home); salad, rice, egg, cooked non-starchy vegetables    Dinner chicken, beef, occ pizza, occ potatoes, peas, beans, corn, rice, broccoli, cabbage, tomatoes, cuccumbers, squash, zucchini    Beverage(s) water, diet  soda, unsweetened tea      Exercise   Exercise Type Moderate (swimming / aerobic walking)    How many days per week to you exercise? 3    How many minutes per day do you exercise? 45    Total minutes per week of exercise 135      Patient Education   Previous Diabetes Education Yes (please comment)   classes in 2017 with [place]   Disease state  Factors that contribute to the development of diabetes;Explored patient's options for treatment of their diabetes    Nutrition management  Role of diet in the treatment of diabetes and the relationship between the three main macronutrients and blood glucose  level;Food label reading, portion sizes and measuring food.;Reviewed blood glucose goals for pre and post meals and how to evaluate the patients' food intake on their blood glucose level.;Meal timing in regards to the patients' current diabetes medication.;Information on hints to eating out and maintain blood glucose control.    Physical activity and exercise  Role of exercise on diabetes management, blood pressure control and cardiac health.;Identified with patient nutritional and/or medication changes necessary with exercise.   has cereal and milk prior to swimming and feels hungry when she gets home   Medications Reviewed patients medication for diabetes, action, purpose, timing of dose and side effects.    Monitoring Taught/evaluated SMBG meter.;Purpose and frequency of SMBG.;Taught/discussed recording of test results and interpretation of SMBG.;Identified appropriate SMBG and/or A1C goals.    Chronic complications Relationship between chronic complications and blood glucose control;Retinopathy and reason for yearly dilated eye exams    Psychosocial adjustment Identified and addressed patients feelings and concerns about diabetes      Individualized Goals (developed by patient)   Reducing Risk Other (comment)   improve blood sugars, prevent diabetes complications, lose weight, lead a healthier lifestyle     Outcomes   Expected Outcomes Demonstrated interest in learning. Expect positive outcomes    Future DMSE 4-6 wks           Individualized Plan for Diabetes Self-Management Training:   Learning Objective:  Patient will have a greater understanding of diabetes self-management. Patient education plan is to attend individual and/or group sessions per assessed needs and concerns.   Plan:   Patient Instructions  Check blood sugars 1 x day before breakfast and 2 hrs after one meal 3 x week Bring blood sugar records to the next appointment Call your doctor for a prescription for:  1. Meter  strips (type) One Touch Verio  checking  3 times per week  2. Lancets (type) One Touch Delica Plus checking  3 times per week Exercise: Continue swimming for  45  minutes  3 days a week Eat 3 meals day,  1-2 snacks a day Space meals 4-6 hours apart Continue to limit desserts/sweets Include 1 serving of protein with each meal Complete 3 Day Food Record and bring to next appt Return for appointment on:  Wednesday July 21 at 9:00 am with Freda Munro (nurse)  Expected Outcomes:  Demonstrated interest in learning. Expect positive outcomes  Education material provided:  General Meal Planning Guidelines Simple Meal Plan Meter - One Touch Verio Flex 3 Day Food Record  If problems or questions, patient to contact team via:  Johny Drilling, RN, [place] [phone]  Future DSME appointment: 4-6 wks  December 01, 2019 with this nurse

## 2019-10-29 NOTE — Patient Instructions (Signed)
Check blood sugars 1 x day before breakfast and 2 hrs after one meal 3 x week Bring blood sugar records to the next appointment/class  Call your doctor for a prescription for:  1. Meter strips (type) One Touch Verio  checking  3 times per week  2. Lancets (type) One Touch Delica Plus checking  3 times per week  Exercise: Continue swimming for  45  minutes  3 days a week  Eat 3 meals day,  1-2 snacks a day Space meals 4-6 hours apart Continue to limit desserts/sweets Include 1 serving of protein with each meal Complete 3 Day Food Record and bring to next appt  Return for appointment/classes on:  Wednesday July 21 at 9:00 am with Velna Hatchet (nurse)

## 2019-11-02 ENCOUNTER — Telehealth: Payer: Self-pay | Admitting: *Deleted

## 2019-11-02 NOTE — Telephone Encounter (Signed)
Received voice mail from patient that she was concerned about her numbers being low. She reports FBG this am was 107 and last night 102 before bed time. She reports that her diabetes med makes her feel bad so she has stopped taking it. She asked me to leave a message because she would not be home until later. Left a message with ADA guidelines for fasting 80-130, 2 hrs after meals less than 180 and before meals and bedtime less than 130. Her blood sugars are within ranges. She has increased her swimming and activities and changed her eating. Instructed her to notify MD office regarding Metformin side effects and if her blood sugars are consistently above ADA guidelines. She has an appt with me in July and Dr Marcello Fennel in Aug.

## 2019-12-01 ENCOUNTER — Encounter: Payer: Self-pay | Admitting: *Deleted

## 2019-12-01 ENCOUNTER — Other Ambulatory Visit: Payer: Self-pay

## 2019-12-01 ENCOUNTER — Encounter: Payer: Medicare PPO | Attending: Internal Medicine | Admitting: *Deleted

## 2019-12-01 VITALS — BP 112/70 | Wt 142.3 lb

## 2019-12-01 DIAGNOSIS — E119 Type 2 diabetes mellitus without complications: Secondary | ICD-10-CM | POA: Diagnosis not present

## 2019-12-01 DIAGNOSIS — Z7189 Other specified counseling: Secondary | ICD-10-CM | POA: Diagnosis present

## 2019-12-01 DIAGNOSIS — I10 Essential (primary) hypertension: Secondary | ICD-10-CM | POA: Insufficient documentation

## 2019-12-01 NOTE — Progress Notes (Signed)
Diabetes Self-Management Education  Visit Type: Follow-up  Appt. Start Time: 0905 Appt. End Time: 0940  12/01/2019  [name], identified by name and date of birth, is a 84 y.o. female with a diagnosis of Diabetes: Type 2.   ASSESSMENT  Blood pressure 112/70, weight 142 lb 4.8 oz (64.5 kg). Body mass index is 26.89 kg/m.   Diabetes Self-Management Education - 12/01/19 0958      Visit Information   Visit Type Follow-up      Initial Visit   Diabetes Type Type 2      Complications   How often do you check your blood sugar? 1-2 times/day    Fasting Blood glucose range (mg/dL) 64-680;321-224   825-003 mg/dL   Postprandial Blood glucose range (mg/dL) 704-888;91-694   503-888; bedtime 88-207 mg/dL - 280 after eating cookies and candy   Have you had a dilated eye exam in the past 12 months? Yes    Have you had a dental exam in the past 12 months? Yes    Are you checking your feet? Yes    How many days per week are you checking your feet? 7      Dietary Intake   Breakfast 3 meals and 2 snacks/day - doesn't always include protein with breakfast; has been eating out more in the last few weeks    Snack (morning) cheese, crackers, grapes   Snack (evening) Ice cream, candy, cookies, cake     Exercise   Exercise Type Moderate (swimming / aerobic walking)    How many days per week to you exercise? 5    How many minutes per day do you exercise? 45    Total minutes per week of exercise 225      Patient Education   Previous Diabetes Education Yes (please comment)    Disease state  Explored patient's options for treatment of their diabetes    Nutrition management  Role of diet in the treatment of diabetes and the relationship between the three main macronutrients and blood glucose level;Food label reading, portion sizes and measuring food.;Reviewed blood glucose goals for pre and post meals and how to evaluate the patients' food intake on their blood glucose level.;Information on  hints to eating out and maintain blood glucose control.    Physical activity and exercise  Identified with patient nutritional and/or medication changes necessary with exercise.;Role of exercise on diabetes management, blood pressure control and cardiac health.    Medications Reviewed patients medication for diabetes, action, purpose, timing of dose and side effects.    Monitoring Purpose and frequency of SMBG.;Taught/discussed recording of test results and interpretation of SMBG.;Identified appropriate SMBG and/or A1C goals.    Chronic complications Relationship between chronic complications and blood glucose control    Psychosocial adjustment Identified and addressed patients feelings and concerns about diabetes      Individualized Goals (developed by patient)   Nutrition Follow meal plan discussed;Other (comment)   Add protein at breakfast before swimming   Physical Activity Exercise 5-7 days per week;45 minutes per day    Monitoring  test my blood glucose as discussed      Post-Education Assessment   Patient understands the diabetes disease and treatment process. Demonstrates understanding / competency    Patient understands incorporating nutritional management into lifestyle. Needs Review    Patient undertands incorporating physical activity into lifestyle. Demonstrates understanding / competency    Patient understands using medications safely. Needs Review    Patient understands monitoring blood glucose, interpreting  and using results Demonstrates understanding / competency    Patient understands prevention, detection, and treatment of acute complications. Demonstrates understanding / competency    Patient understands prevention, detection, and treatment of chronic complications. Demonstrates understanding / competency    Patient understands how to develop strategies to address psychosocial issues. Demonstrates understanding / competency    Patient understands how to develop strategies to  promote health/change behavior. Demonstrates understanding / competency      Outcomes   Expected Outcomes Demonstrated interest in learning. Expect positive outcomes    Future DMSE PRN   Program Status Completed     Subsequent Visit   Since your last visit have you continued or begun to take your medications as prescribed? No   took Metformin x 1 week and reported feeling fatigue and achy bones   Since your last visit have you had your blood pressure checked? No    Since your last visit have you experienced any weight changes? Loss    Weight Loss (lbs) 3.3    Since your last visit, are you checking your blood glucose at least once a day? Yes           Individualized Plan for Diabetes Self-Management Training:   Learning Objective:  Patient will have a greater understanding of diabetes self-management. Patient education plan is to attend individual and/or group sessions per assessed needs and concerns.   Plan:   Patient Instructions  Check blood sugars 1 x day before breakfast or  2 hrs after one meal 3 x week  Bring blood sugar records to MD appointments Exercise: Continue swimming  for  45  minutes 5-7 days a week Eat 3 meals day, 1-2  snacks a day Space meals 4-6 hours apart Include 1 serving of protein with breakfast Limit desserts/sweets Call for any questions  Expected Outcomes:  Demonstrated interest in learning. Expect positive outcomes  Education material provided:  Planning a Balanced Meal  If problems or questions, patient to contact team via:  Sharion Settler, RN, CCM, CDCES [phone]  Future DSME appointment: PRN

## 2019-12-01 NOTE — Patient Instructions (Signed)
Check blood sugars 1 x day before breakfast or  2 hrs after one meal 3 x week  Bring blood sugar records to MD appointments  Exercise: Continue swimming  for  45  minutes 5-7 days a week  Eat 3 meals day, 1-2  snacks a day Space meals 4-6 hours apart Include 1 serving of protein with breakfast Limit desserts/sweets  Call for any questions

## 2020-12-13 ENCOUNTER — Other Ambulatory Visit: Payer: Self-pay

## 2020-12-13 ENCOUNTER — Encounter: Payer: Self-pay | Admitting: Plastic Surgery

## 2020-12-13 ENCOUNTER — Ambulatory Visit: Payer: Medicare PPO | Admitting: Plastic Surgery

## 2020-12-13 VITALS — BP 151/92 | HR 87 | Ht 63.0 in | Wt 149.0 lb

## 2020-12-13 DIAGNOSIS — L989 Disorder of the skin and subcutaneous tissue, unspecified: Secondary | ICD-10-CM | POA: Diagnosis not present

## 2020-12-13 NOTE — Progress Notes (Signed)
Referring Provider Barbette Reichmann, MD [address] [facility] [place],  [place] [address]   CC:  Chief Complaint  Patient presents with   Advice Only      Yvette Elliott is an 85 y.o. female.  HPI: Patient presents to discuss 2 moles on her right cheek that are bothersome.  They are raised and growing in size.  They are both pigmented.  She would like them to be removed.  She also wanted to discuss upper lid skin.  She believes she has had 2 upper lid blepharoplasties in the past.  She currently shaves her eyebrows and draws them on.  She feels like her vision is okay but has not had that evaluated in a while.  Allergies  Allergen Reactions   Lisinopril Swelling    In mouth   Gemfibrozil     Questionable   Iodine Hives and Itching    IVP dye   Tylenol [Acetaminophen]     Issue with liver   Aleve [Naproxen Sodium] Rash   Ibuprofen Rash    States she can take advil    Outpatient Encounter Medications as of 12/13/2020  Medication Sig Note   amLODipine (NORVASC) 5 MG tablet Take 1 tablet by mouth daily.    calcium carbonate (TUMS EX) 750 MG chewable tablet Chew 300 mg by mouth daily as needed.    hydrochlorothiazide (HYDRODIURIL) 12.5 MG tablet Take 1 tablet by mouth daily.    HYDROcodone-acetaminophen (NORCO) 7.5-325 MG tablet Take 1 tablet by mouth every 6 (six) hours as needed for moderate pain. Take 1/2 tablet by mouth as needed.    LORazepam (ATIVAN) 0.5 MG tablet Take 0.5 mg by mouth daily as needed. 04/21/2015: Received from: [facility] Received Sig: Take by mouth.   Multiple Vitamin (MULTI-VITAMINS) TABS Take 1 tablet by mouth daily. 04/21/2015: Received from: [facility] Received Sig: Take by mouth.   omeprazole (PRILOSEC) 20 MG capsule Take 20 mg by mouth daily as needed. 04/21/2015: Received from: [facility] Received Sig: Take by mouth.   rosuvastatin (CRESTOR) 10 MG tablet Take 10 mg by mouth  daily. 04/21/2015: Received from: [facility] Received Sig: Take by mouth.   [DISCONTINUED] metFORMIN (GLUCOPHAGE) 500 MG tablet Take 500 mg by mouth daily.    No facility-administered encounter medications on file as of 12/13/2020.     Past Medical History:  Diagnosis Date   Colon polyp    Diabetes mellitus without complication (HCC)    Hemorrhoids    Hyperlipidemia    Hypertension    Insomnia     Past Surgical History:  Procedure Laterality Date   APPENDECTOMY     CHOLECYSTECTOMY     COLONOSCOPY  2014   LUNG SURGERY     Removal of scar tissue   TONSILLECTOMY     TOTAL ANKLE ARTHROPLASTY Right     Family History  Problem Relation Age of Onset   Leukemia Sister 72   Diabetes Sister    Diabetes Mother    Diabetes Father    Diabetes Brother    Diabetes Maternal Grandfather    Diabetes Paternal Grandmother    Diabetes Brother    Breast cancer Maternal Grandmother 77    Social History   Social History Narrative   Not on file     Review of Systems General: Denies fevers, chills, weight loss CV: Denies chest pain, shortness of breath, palpitations  Physical Exam Vitals with BMI  12/13/2020 12/01/2019 10/29/2019  Height 5\' 3"  - 5\' 1"   Weight 149 lbs 142 lbs 5 oz 145 lbs 10 oz  BMI 26.4 - 27.53  Systolic 151 112  Diastolic 92 70 70  Pulse 87 - -    General:  No acute distress,  Alert and oriented, Non-Toxic, Normal speech and affect Examination shows 2 raised pigmented moles on the right cheek.  1 is preauricular and the other is down closer to the angle of the mandible.  They are about 1 cm in diameter and have an irregular shape.  She has very mild dermatochalasis.  Is difficult to tell where her true eyebrow line is.  Assessment/Plan Patient presents with 2 changing skin lesions in the right cheek.  We discussed a number of options but ultimately I recommended shave excision of these.  We discussed the risk that include bleeding, infection,  damage to surrounding structures need for additional procedures.  We discussed the potential for recurrence.  All her questions were answered and plan to move forward.  I do not recommend an additional blepharoplasty.  I do feel like if much more skin was removed her true eyebrow line would get closer to her eyelashes which would probably be unfavorable.  She is okay with this.  12/13/2020, 11:49 AM

## 2021-01-31 ENCOUNTER — Other Ambulatory Visit (HOSPITAL_BASED_OUTPATIENT_CLINIC_OR_DEPARTMENT_OTHER): Payer: Self-pay | Admitting: Internal Medicine

## 2021-01-31 ENCOUNTER — Other Ambulatory Visit: Payer: Self-pay | Admitting: Internal Medicine

## 2021-01-31 DIAGNOSIS — R42 Dizziness and giddiness: Secondary | ICD-10-CM

## 2021-01-31 DIAGNOSIS — R0602 Shortness of breath: Secondary | ICD-10-CM

## 2021-01-31 DIAGNOSIS — R109 Unspecified abdominal pain: Secondary | ICD-10-CM

## 2021-02-08 ENCOUNTER — Ambulatory Visit: Payer: Medicare PPO | Admitting: Plastic Surgery

## 2021-02-08 ENCOUNTER — Other Ambulatory Visit: Payer: Self-pay

## 2021-02-08 ENCOUNTER — Encounter: Payer: Self-pay | Admitting: Plastic Surgery

## 2021-02-08 ENCOUNTER — Other Ambulatory Visit (HOSPITAL_COMMUNITY)
Admission: RE | Admit: 2021-02-08 | Discharge: 2021-02-08 | Disposition: A | Discharge status: Home / Self Care | Payer: Medicare PPO | Source: Ambulatory Visit | Attending: Plastic Surgery | Admitting: Plastic Surgery

## 2021-02-08 VITALS — BP 139/86 | HR 92

## 2021-02-08 DIAGNOSIS — L821 Other seborrheic keratosis: Secondary | ICD-10-CM | POA: Diagnosis not present

## 2021-02-08 DIAGNOSIS — L989 Disorder of the skin and subcutaneous tissue, unspecified: Secondary | ICD-10-CM | POA: Insufficient documentation

## 2021-02-08 NOTE — Progress Notes (Signed)
Operative Note   DATE OF OPERATION: 02/08/2021  LOCATION:    SURGICAL DEPARTMENT: Plastic Surgery  PREOPERATIVE DIAGNOSES: Right cheek skin lesions  POSTOPERATIVE DIAGNOSES:  same  PROCEDURE:  Shave excision right cheek skin lesions totaling 1.5 cm each  SURGEON: Ancil Linsey, MD  ANESTHESIA:  Local  COMPLICATIONS: None.   INDICATIONS FOR PROCEDURE:  The patient, Yvette Elliott is a 85 y.o. female born on [birth year], is here for treatment of right cheek skin lesion MRN: [id number]  CONSENT:  Informed consent was obtained directly from the patient. Risks, benefits and alternatives were fully discussed. Specific risks including but not limited to bleeding, infection, hematoma, seroma, scarring, pain, infection, wound healing problems, and need for further surgery were all discussed. The patient did have an ample opportunity to have questions answered to satisfaction.   DESCRIPTION OF PROCEDURE:  Local anesthesia was administered. The patient's operative site was prepped and draped in a sterile fashion. A time out was performed and all information was confirmed to be correct.  The lesions were excised with a 15 blade.  Hemostasis was obtained.  Band-Aids were applied  The patient tolerated the procedure well.  There were no complications.

## 2021-02-12 LAB — SURGICAL PATHOLOGY

## 2021-02-19 ENCOUNTER — Ambulatory Visit
Admission: RE | Admit: 2021-02-19 | Discharge: 2021-02-19 | Disposition: A | Discharge status: Home / Self Care | Payer: Medicare PPO | Source: Ambulatory Visit | Attending: Internal Medicine | Admitting: Internal Medicine

## 2021-02-19 ENCOUNTER — Other Ambulatory Visit: Payer: Self-pay

## 2021-02-19 DIAGNOSIS — R42 Dizziness and giddiness: Secondary | ICD-10-CM

## 2021-02-19 DIAGNOSIS — R0602 Shortness of breath: Secondary | ICD-10-CM | POA: Diagnosis not present

## 2021-02-19 DIAGNOSIS — R109 Unspecified abdominal pain: Secondary | ICD-10-CM | POA: Diagnosis present

## 2021-02-19 LAB — POCT I-STAT CREATININE: Creatinine, Ser: 0.6 mg/dL (ref 0.44–1.00)

## 2021-02-19 MED ORDER — IOHEXOL 350 MG/ML SOLN
80.0000 mL | Freq: Once | INTRAVENOUS | Status: AC | PRN
  Administered 2021-02-19: 80 mL via INTRAVENOUS

## 2021-09-17 ENCOUNTER — Other Ambulatory Visit: Payer: Self-pay | Admitting: Physical Medicine and Rehabilitation

## 2021-09-17 DIAGNOSIS — M5412 Radiculopathy, cervical region: Secondary | ICD-10-CM

## 2021-10-01 ENCOUNTER — Ambulatory Visit
Admission: RE | Admit: 2021-10-01 | Discharge: 2021-10-01 | Disposition: A | Discharge status: Home / Self Care | Payer: Medicare PPO | Source: Ambulatory Visit | Attending: Physical Medicine and Rehabilitation | Admitting: Physical Medicine and Rehabilitation

## 2021-10-01 DIAGNOSIS — M5412 Radiculopathy, cervical region: Secondary | ICD-10-CM | POA: Insufficient documentation

## 2021-10-02 ENCOUNTER — Ambulatory Visit: Payer: Medicare PPO

## 2021-10-22 ENCOUNTER — Other Ambulatory Visit: Payer: Self-pay | Admitting: Physical Medicine and Rehabilitation

## 2021-10-22 DIAGNOSIS — M5416 Radiculopathy, lumbar region: Secondary | ICD-10-CM

## 2021-10-24 ENCOUNTER — Ambulatory Visit
Admission: RE | Admit: 2021-10-24 | Discharge: 2021-10-24 | Disposition: A | Discharge status: Home / Self Care | Payer: Medicare PPO | Source: Ambulatory Visit | Attending: Physical Medicine and Rehabilitation | Admitting: Physical Medicine and Rehabilitation

## 2021-10-24 DIAGNOSIS — M5416 Radiculopathy, lumbar region: Secondary | ICD-10-CM | POA: Insufficient documentation

## 2022-03-30 NOTE — Progress Notes (Unsigned)
Cardiology Office Note  Date:  04/01/2022   ID:  Yvette Elliott, DOB 1935/08/14, MRN [id number]  PCP:  Yvette Reichmann, MD   Chief Complaint  Patient presents with   New Patient (Initial Visit)    Ref by Dr. Marcello Elliott to establish care for SVT/palpitations. Patient c/o frequent palpitations, chest pressure and shortness of breath with over exertion. Medications reviewed by the patient verbally.     HPI:  Yvette Elliott is a 86 year old woman with Past medical history of diabetes type 2 GAD Hypertension Hyperlipidemia Insomnia COVID in Jan 2022  Who presents by referral from Dr. Marcello Elliott for palpitations, concern for cardiac arrhythmia  Previously tried metoprolol for palpitations, reports that she was unable to tolerate this Lorazepam seems to help her palpitations  Appreciated palpitations , will wake at night  Year ago palpitations started Would notice tachycardia rate 120 bpm in bed  Has apple watch:, low burden afib  Off thyoid medication and crestor, was told by [facility] cardiology it could contribute to palpitations  Prior testing reviewed Echo 4/22 NORMAL LEFT VENTRICULAR SYSTOLIC FUNCTION  NORMAL RIGHT VENTRICULAR SYSTOLIC FUNCTION  TRIVIAL REGURGITATION NOTED (See above)  NO VALVULAR STENOSIS   Stress test 4/22: no ischemia  Monitotr 2/22, 6/23  Holter 24 hr June 2023 The holter monitor shows occasional PVCs, PACs, supraventricular tachycardia, sinus tachycardia, and asymptomatic bradycardia with 6% pacs   EKG personally reviewed by myself on todays visit NSR rate 80 bpm no St or T wave changes   PMH:   has a past medical history of Colon polyp, Diabetes mellitus without complication (HCC), Hemorrhoids, Hyperlipidemia, Hypertension, and Insomnia.  PSH:    Past Surgical History:  Procedure Laterality Date   APPENDECTOMY     CHOLECYSTECTOMY     COLONOSCOPY  2014   LUNG SURGERY     Removal of scar tissue   TONSILLECTOMY     TOTAL ANKLE ARTHROPLASTY Right      Current Outpatient Medications  Medication Sig Dispense Refill   amLODipine (NORVASC) 5 MG tablet Take 1 tablet by mouth daily.     calcium carbonate (TUMS EX) 750 MG chewable tablet Chew 300 mg by mouth daily as needed.     hydrochlorothiazide (HYDRODIURIL) 12.5 MG tablet Take 1 tablet by mouth daily.     HYDROcodone-acetaminophen (NORCO) 7.5-325 MG tablet Take 1 tablet by mouth every 6 (six) hours as needed for moderate pain. Take 1/2 tablet by mouth as needed.     LORazepam (ATIVAN) 0.5 MG tablet Take 0.5 mg by mouth daily as needed.     Multiple Vitamin (MULTI-VITAMINS) TABS Take 1 tablet by mouth daily.     omeprazole (PRILOSEC) 20 MG capsule Take 20 mg by mouth daily as needed. (Patient not taking: Reported on 04/01/2022)     rosuvastatin (CRESTOR) 10 MG tablet Take 10 mg by mouth daily. (Patient not taking: Reported on 04/01/2022)     No current facility-administered medications for this visit.     Allergies:   Methimazole, Lisinopril, Gemfibrozil, Iodine, Tylenol [acetaminophen], Aleve [naproxen sodium], and Ibuprofen   Social History:  The patient  reports that she has never smoked. She has never used smokeless tobacco. She reports that she does not drink alcohol and does not use drugs.   Family History:   family history includes Breast cancer (age of onset: 36) in her maternal grandmother; Diabetes in her brother, brother, father, maternal grandfather, mother, paternal grandmother, and sister; Leukemia (age of onset: 39) in her sister.  Review of Systems: Review of Systems  Constitutional: Negative.   HENT: Negative.    Respiratory: Negative.    Cardiovascular:  Positive for palpitations.  Gastrointestinal: Negative.   Musculoskeletal: Negative.   Neurological: Negative.   Psychiatric/Behavioral: Negative.    All other systems reviewed and are negative.    PHYSICAL EXAM: VS:  BP 136/70 (BP Location: Left Arm, Patient Position: Sitting, Cuff Size: Normal)    Pulse 80   Ht 5' 1.5" (1.562 m)   Wt 138 lb 4 oz (62.7 kg)   SpO2 98%   BMI 25.70 kg/m  , BMI Body mass index is 25.7 kg/m. GEN: Well nourished, well developed, in no acute distress HEENT: normal Neck: no JVD, carotid bruits, or masses Cardiac: RRR; no murmurs, rubs, or gallops,no edema  Respiratory:  clear to auscultation bilaterally, normal work of breathing GI: soft, nontender, nondistended, + BS Yvette: no deformity or atrophy Skin: warm and dry, no rash Neuro:  Strength and sensation are intact Psych: euthymic mood, full affect  Recent Labs: No results found for requested labs within last 365 days.    Lipid Panel No results found for: "CHOL", "HDL", "LDLCALC", "TRIG"    Wt Readings from Last 3 Encounters:  04/01/22 138 lb 4 oz (62.7 kg)  12/13/20 149 lb (67.6 kg)  12/01/19 142 lb 4.8 oz (64.5 kg)     ASSESSMENT AND PLAN:  Problem List Items Addressed This Visit   None Visit Diagnoses     Palpitations    -  Primary   Controlled type 2 diabetes mellitus without complication, without long-term current use of insulin (HCC)       GAD (generalized anxiety disorder)       Essential hypertension       Mixed hyperlipidemia          Palpitations Reports waking with tachycardia palpitations has to take lorazepam for symptom relief Apple Watch telling her she is in atrial fibrillation low burden We have ordered a Zio monitor 2 weeks Recommended she take metoprolol tartrate 25 mg as needed for symptoms After monitor complete she could start this in the evening on a regular basis as most of her symptoms are overnight Ectopy appreciated on clinical exam today, likely PACs or sinus arrhythmia  Hyperlipidemia Reports that she stopped Crestor, was told by outpatient cardiology that this may be contributing to her palpitations Is going to have repeat lab work with primary care  Essential hypertension Blood pressure is well controlled on today's visit. No changes made to the  medications.    Total encounter time more than 50 minutes  Greater than 50% was spent in counseling and coordination of care with the patient    Signed, Yvette Elliott, M.D., Ph.D. [place], [place]

## 2022-04-01 ENCOUNTER — Encounter: Payer: Self-pay | Admitting: Cardiovascular Disease

## 2022-04-01 ENCOUNTER — Ambulatory Visit (INDEPENDENT_AMBULATORY_CARE_PROVIDER_SITE_OTHER): Payer: Medicare PPO

## 2022-04-01 ENCOUNTER — Ambulatory Visit: Payer: Medicare PPO | Attending: Cardiovascular Disease | Admitting: Cardiovascular Disease

## 2022-04-01 VITALS — BP 136/70 | HR 80 | Ht 61.5 in | Wt 138.2 lb

## 2022-04-01 DIAGNOSIS — F411 Generalized anxiety disorder: Secondary | ICD-10-CM

## 2022-04-01 DIAGNOSIS — R002 Palpitations: Secondary | ICD-10-CM | POA: Diagnosis not present

## 2022-04-01 DIAGNOSIS — I10 Essential (primary) hypertension: Secondary | ICD-10-CM

## 2022-04-01 DIAGNOSIS — E119 Type 2 diabetes mellitus without complications: Secondary | ICD-10-CM

## 2022-04-01 DIAGNOSIS — E782 Mixed hyperlipidemia: Secondary | ICD-10-CM

## 2022-04-01 MED ORDER — METOPROLOL TARTRATE 25 MG PO TABS: 25.0000 mg | ORAL_TABLET | Freq: Two times a day (BID) | ORAL | 3 refills | Status: DC | PRN

## 2022-04-01 NOTE — Patient Instructions (Addendum)
Medication Instructions:  - Your physician has recommended you make the following change in your medication:   1) START Metoprolol tartrate 25 mg: - take 1 tablet twice a day as needed for palpitations  If you need a refill on your cardiac medications before your next appointment, please call your pharmacy.    Lab work: No new labs needed   Testing/Procedures: 1)  Heart Monitor:  Length of Wear: 14 days  Your monitor will be mailed to your home address within 3-5 business days. However, if you have not received your monitor after 5 business days please send [place] a MyChart message or call the office at [phone], so we may follow up on this for you.   Your physician has recommended that you wear a Zio XT (heart) monitor.   This monitor is a medical device that records the heart's electrical activity. Doctors most often use these monitors to diagnose arrhythmias. Arrhythmias are problems with the speed or rhythm of the heartbeat. The monitor is a small device applied to your chest. You can wear one while you do your normal daily activities. While wearing this monitor if you have any symptoms to push the button and record what you felt. Once you have worn this monitor for the period of time provider prescribed (Usually 14 days), you will return the monitor device in the postage paid box. Once it is returned they will download the data collected and provide [place] with a report which the provider will then review and we will call you with those results. Important tips:  Avoid showering during the first 24 hours of wearing the monitor. Avoid excessive sweating to help maximize wear time. Do not submerge the device, no hot tubs, and no swimming pools. Keep any lotions or oils away from the patch. After 24 hours you may shower with the patch on. Take brief showers with your back facing the shower head.  Do not remove patch once it has been placed because that will interrupt data and decrease  adhesive wear time. Push the button when you have any symptoms and write down what you were feeling. Once you have completed wearing your monitor, remove and place into box which has postage paid and place in your outgoing mailbox.  If for some reason you have misplaced your box then call our office and we can provide another box and/or mail it off for you.       Follow-Up: At [facility], you and your health needs are our priority.  As part of our continuing mission to provide you with exceptional heart care, we have created designated Provider Care Teams.  These Care Teams include your primary Cardiologist (physician) and Advanced Practice Providers (APPs -  Physician Assistants and Nurse Practitioners) who all work together to provide you with the care you need, when you need it.  You will need a follow up appointment in 6 weeks  Providers on your designated Care Team:   Nicolasa Ducking, NP Eula Listen, PA-C Cadence Fransico Michael, [place]  COVID-19 Vaccine Information can be found at: [email or web address] For questions related to vaccine distribution or appointments, please email [email or web address] or call [phone].    Metoprolol Tablets What is this medication? METOPROLOL (me TOE proe lole) treats high blood pressure. It also prevents chest pain (angina) or further damage after a heart attack. It works by lowering your blood pressure and heart rate, making it easier for your heart to pump blood to the rest of your body. It belongs  to a group of medications called beta blockers. This medicine may be used for other purposes; ask your health care provider or pharmacist if you have questions. COMMON BRAND NAME(S): Lopressor What should I tell my care team before I take this medication? They need to know if you have any of these conditions: Diabetes Heart or vessel disease, such as slow heartbeat, worsening heart failure, heart  block, sick sinus syndrome, or Raynaud syndrome Kidney disease Liver disease Lung or breathing disease, such as asthma or emphysema Pheochromocytoma Thyroid disease An unusual or allergic reaction to metoprolol, other medications, foods, dyes, or preservatives Pregnant or trying to get pregnant Breastfeeding How should I use this medication? Take this medication by mouth with water. Take it as directed on the prescription label at the same time every day. You can take it with or without food. You should always take it the same way. Keep taking it unless your care team tells you to stop. Talk to your care team about the use of this medication in children. Special care may be needed. Overdosage: If you think you have taken too much of this medicine contact a poison control center or emergency room at once. NOTE: This medicine is only for you. Do not share this medicine with others. What if I miss a dose? If you miss a dose, take it as soon as you can. If it is almost time for your next dose, take only that dose. Do not take double or extra doses. What may interact with this medication? This medication may interact with the following: Certain medications for blood pressure, heart disease, irregular heartbeat Certain medications for depression like monoamine oxidase (MAO) inhibitors, fluoxetine, or paroxetine Clonidine Dobutamine Epinephrine Isoproterenol Reserpine This list may not describe all possible interactions. Give your health care provider a list of all the medicines, herbs, non-prescription drugs, or dietary supplements you use. Also tell them if you smoke, drink alcohol, or use illegal drugs. Some items may interact with your medicine. What should I watch for while using this medication? Visit your care team for regular checks on your progress. Check your blood pressure as directed. Know what your blood pressure should be and when to contact your care team. Do not treat yourself  for coughs, colds, or pain while you are using this medication without asking your care team for advice. Some medications may increase your blood pressure. This medication may affect your coordination, reaction time, or judgment. Do not drive or operate machinery until you know how this medication affects you. Sit up or stand slowly to reduce the risk of dizzy or fainting spells. Drinking alcohol with this medication can increase the risk of these side effects. This medication may increase blood sugar. Ask your care team if changes in diet or medications are needed if you have diabetes. What side effects may I notice from receiving this medication? Side effects that you should report to your care team as soon as possible: Allergic reactions--skin rash, itching, hives, swelling of the face, lips, tongue, or throat Heart failure--shortness of breath, swelling of the ankles, feet, or hands, sudden weight gain, unusual weakness or fatigue Low blood pressure--dizziness, feeling faint or lightheaded, blurry vision Raynaud's--cool, numb, or painful fingers or toes that may change color from pale, to blue, to red Slow heartbeat--dizziness, feeling faint or lightheaded, confusion, trouble breathing, unusual weakness or fatigue Worsening mood, feelings of depression Side effects that usually do not require medical attention (report to your care team if they continue  or are bothersome): Change in sex drive or performance Diarrhea Dizziness Fatigue Headache This list may not describe all possible side effects. Call your doctor for medical advice about side effects. You may report side effects to FDA at 1-800-FDA-1088. Where should I keep my medication? Keep out of the reach of children and pets. Store at room temperature between 15 and 30 degrees C (59 and 86 degrees F). Protect from moisture. Keep the container tightly closed. Throw away any unused medication after the expiration date. NOTE: This sheet is  a summary. It may not cover all possible information. If you have questions about this medicine, talk to your doctor, pharmacist, or health care provider.  2023 Elsevier/Gold Standard (2020-06-23 00:00:00)

## 2022-04-09 DIAGNOSIS — R002 Palpitations: Secondary | ICD-10-CM

## 2022-05-09 NOTE — Progress Notes (Signed)
Cardiology Office Note  Date:  05/14/2022   ID:  Yvette Elliott, DOB 08-07-1935, MRN [id number]  PCP:  Tracie Harrier, MD   Chief Complaint  Patient presents with   6 week follow up     Patient woke up this morning with a "flip flopping" sensation in chest that lasted about 8 minutes. Medications reviewed by the patient verbally.      HPI:  Yvette Elliott is a 86 year old woman with Past medical history of  diabetes type 2 GAD Hypertension Hyperlipidemia Insomnia COVID in Jan 2022  Who presents for f/u of her cardiac arrhythmia  LOV 11/23 Reports that she woke up this morning with some tachypalpitations Possibly readjusting after trip to [place] to see family She took a metoprolol tartrate this morning with improvement of symptoms Has been taking this sporadically, possibly 5 times in the past several months  Zio monitor December 2023, results reviewed Normal sinus rhythm Patient had a min HR of 46 bpm, max HR of 174 bpm, and avg HR of 70 bpm 20 Supraventricular Tachycardia runs occurred, the run with the fastest interval lasting 11 beats with a max rate of 174 bpm, the longest lasting 15.7 secs with an avg rate of 126 bpm. (Not patient triggered) Isolated SVEs were occasional (2.6%, 35979), SVE Couplets were rare (<1.0%, 2356), and SVE Triplets were rare (<1.0%, 502).  Isolated VEs were rare (<1.0%, 9978), VE Couplets were rare (<1.0%, 102),  and VE Triplets were rare (<1.0%, 1). Ventricular Bigeminy and Trigeminy were present.  Patient triggered events (12) associated with normal sinus rhythm, rare PAC, PVC  Previously tried metoprolol on a regular cysts for palpitations, reports that she was unable to tolerate this Lorazepam seems to help her palpitations  More of her palpitations seem to present at nighttime Has apple watch to monitor her heart rate  Reports she is not taking amlodipine, she does take HCTZ, She is back on thyroid medication Not on  Crestor  EKG personally reviewed by myself on todays visit Sinus bradycardia rate 57 bpm no significant ST-T wave changes  Prior testing reviewed Echo 4/22 NORMAL LEFT VENTRICULAR SYSTOLIC FUNCTION  NORMAL RIGHT VENTRICULAR SYSTOLIC FUNCTION  TRIVIAL REGURGITATION NOTED (See above)  NO VALVULAR STENOSIS   Stress test 4/22: no ischemia  Monitotr 2/22, 6/23  Holter 24 hr June 2023 The holter monitor shows occasional PVCs, PACs, supraventricular tachycardia, sinus tachycardia, and asymptomatic bradycardia with 6% pacs   EKG personally reviewed by myself on todays visit NSR rate 80 bpm no St or T wave changes   PMH:   has a past medical history of Colon polyp, Diabetes mellitus without complication ([place]), Hemorrhoids, Hyperlipidemia, Hypertension, and Insomnia.  PSH:    Past Surgical History:  Procedure Laterality Date   APPENDECTOMY     CHOLECYSTECTOMY     COLONOSCOPY  2014   LUNG SURGERY     Removal of scar tissue   TONSILLECTOMY     TOTAL ANKLE ARTHROPLASTY Right     Current Outpatient Medications  Medication Sig Dispense Refill   calcium carbonate (TUMS EX) 750 MG chewable tablet Chew 300 mg by mouth daily as needed.     hydrochlorothiazide (HYDRODIURIL) 12.5 MG tablet Take 1 tablet by mouth daily.     HYDROcodone-acetaminophen (NORCO) 7.5-325 MG tablet Take 1 tablet by mouth every 6 (six) hours as needed for moderate pain. Take 1/2 tablet by mouth as needed.     LORazepam (ATIVAN) 0.5 MG tablet Take 0.5 mg by mouth  daily as needed.     metoprolol tartrate (LOPRESSOR) 25 MG tablet Take 1 tablet (25 mg total) by mouth 2 (two) times daily as needed (fast heart beat). 60 tablet 3   Multiple Vitamin (MULTI-VITAMINS) TABS Take 1 tablet by mouth daily.     omeprazole (PRILOSEC) 20 MG capsule Take 20 mg by mouth daily as needed. (Patient not taking: Reported on 04/01/2022)     No current facility-administered medications for this visit.     Allergies:   Methimazole,  Lisinopril, Gemfibrozil, Iodine, Tylenol [acetaminophen], Aleve [naproxen sodium], and Ibuprofen   Social History:  The patient  reports that she has never smoked. She has never used smokeless tobacco. She reports that she does not drink alcohol and does not use drugs.   Family History:   family history includes Breast cancer (age of onset: 58) in her maternal grandmother; Diabetes in her brother, brother, father, maternal grandfather, mother, paternal grandmother, and sister; Leukemia (age of onset: 77) in her sister.    Review of Systems: Review of Systems  Constitutional: Negative.   HENT: Negative.    Respiratory: Negative.    Cardiovascular:  Positive for palpitations.  Gastrointestinal: Negative.   Musculoskeletal: Negative.   Neurological: Negative.   Psychiatric/Behavioral: Negative.    All other systems reviewed and are negative.  PHYSICAL EXAM: VS:  BP 130/80 (BP Location: Left Arm, Patient Position: Sitting, Cuff Size: Normal)   Pulse (!) 57   Ht 5' 1.5" (1.562 m)   Wt 141 lb 2 oz (64 kg)   SpO2 97%   BMI 26.23 kg/m  , BMI Body mass index is 26.23 kg/m. Constitutional:  oriented to person, place, and time. No distress.  HENT:  Head: Grossly normal Eyes:  no discharge. No scleral icterus.  Neck: No JVD, no carotid bruits  Cardiovascular: Regular rate and rhythm, no murmurs appreciated Pulmonary/Chest: Clear to auscultation bilaterally, no wheezes or rails Abdominal: Soft.  no distension.  no tenderness.  Musculoskeletal: Normal range of motion Neurological:  normal muscle tone. Coordination normal. No atrophy Skin: Skin warm and dry Psychiatric: normal affect, pleasant  Recent Labs: No results found for requested labs within last 365 days.    Lipid Panel No results found for: "CHOL", "HDL", "LDLCALC", "TRIG"    Wt Readings from Last 3 Encounters:  05/14/22 141 lb 2 oz (64 kg)  04/01/22 138 lb 4 oz (62.7 kg)  12/13/20 149 lb (67.6 kg)     ASSESSMENT AND  PLAN:  Problem List Items Addressed This Visit   None Visit Diagnoses     Palpitations    -  Primary   Relevant Orders   EKG 12-Lead   Controlled type 2 diabetes mellitus without complication, without long-term current use of insulin (HCC)       GAD (generalized anxiety disorder)       Essential hypertension       Mixed hyperlipidemia         Palpitations Likely secondary to PACs No atrial fibrillation noted on Zio monitor Triggered events on Zio associated with normal sinus rhythm and PACs Recommend she continue metoprolol tartrate 25 mg as needed for symptoms If symptoms get worse could do daily dose of metoprolol succinate or bystolic with metoprolol tartrate as needed  Hyperlipidemia Minimal aortic atherosclerosis on CT scan 2022, images pulled up and reviewed She prefers not to be on Crestor Reasonable cholesterol off medication, 180  Essential hypertension On HCTZ, not on amlodipine Numbers reasonable Medication list updated  Total encounter time more than 30 minutes  Greater than 50% was spent in counseling and coordination of care with the patient    Signed, Esmond Plants, M.D., Ph.D. [place], [place]

## 2022-05-14 ENCOUNTER — Ambulatory Visit: Payer: Medicare PPO | Attending: Cardiovascular Disease | Admitting: Cardiovascular Disease

## 2022-05-14 ENCOUNTER — Encounter: Payer: Self-pay | Admitting: Cardiovascular Disease

## 2022-05-14 VITALS — BP 130/80 | HR 57 | Ht 61.5 in | Wt 141.1 lb

## 2022-05-14 DIAGNOSIS — I10 Essential (primary) hypertension: Secondary | ICD-10-CM | POA: Diagnosis not present

## 2022-05-14 DIAGNOSIS — R002 Palpitations: Secondary | ICD-10-CM

## 2022-05-14 DIAGNOSIS — E782 Mixed hyperlipidemia: Secondary | ICD-10-CM

## 2022-05-14 DIAGNOSIS — E119 Type 2 diabetes mellitus without complications: Secondary | ICD-10-CM

## 2022-05-14 DIAGNOSIS — F411 Generalized anxiety disorder: Secondary | ICD-10-CM

## 2022-05-14 NOTE — Patient Instructions (Addendum)
Medication Instructions:   1. looks like Dr. Rockey Situ Took Amlodipine and Rosuvastatin off your medication list.   If you need a refill on your cardiac medications before your next appointment, please call your pharmacy.   Lab work: No new labs needed  Testing/Procedures: No new testing needed  Follow-Up: At [facility], you and your health needs are our priority.  As part of our continuing mission to provide you with exceptional heart care, we have created designated Provider Care Teams.  These Care Teams include your primary Cardiologist (physician) and Advanced Practice Providers (APPs -  Physician Assistants and Nurse Practitioners) who all work together to provide you with the care you need, when you need it.  You will need a follow up appointment in 12 months  Providers on your designated Care Team:   Murray Hodgkins, NP Christell Faith, PA-C Cadence Kathlen Mody, [place]  COVID-19 Vaccine Information can be found at: [email or web address] For questions related to vaccine distribution or appointments, please email [email or web address] or call [phone].

## 2023-01-21 ENCOUNTER — Telehealth: Payer: Self-pay | Admitting: Cardiovascular Disease

## 2023-01-21 DIAGNOSIS — M7989 Other specified soft tissue disorders: Secondary | ICD-10-CM

## 2023-01-21 NOTE — Telephone Encounter (Signed)
Returned the call to the patient. She stated that she has had been having left foot swelling for a month. She stated that this is just in the left foot. The swelling does not go down at night. She denies pain or discoloration. She has tried to elevate her foot which does not help. She denies weight gain and shortness of breath. She takes 12.5 mg Hydrochlorothiazide.   The patient stated that she has been prescribed Metoprolol 25 mg prn for tachycardia. She had been having heart rates in the 100's at night so she stated that her PCP suggested that she take the Metoprolol 25 mg once daily. Since then she has been having heart rates in the 50's and feelings of fatigue. She would like to know what Dr. Mariah Milling suggests and also wonders if she try Metoprolol 12.5 mg.   PCP has also suggested that she follow up with Dr. Mariah Milling. She has an appointment 10/28

## 2023-01-21 NOTE — Telephone Encounter (Signed)
Pt c/o swelling/edema: STAT if pt has developed SOB within 24 hours  If swelling, where is the swelling located? Just left foot, ankle a little, maybe around knee, just on left lower extremity  How much weight have you gained and in what time span? no  Have you gained 2 pounds in a day or 5 pounds in a week? no  Do you have a log of your daily weights (if so, list)? no  Are you currently taking a fluid pill? yes  Are you currently SOB? No   Have you traveled recently in a car or plane for an extended period of time? No  PCP has advised to take Metoprolol every day and would like to discuss, states she thinks her pulse is so low( around 60, which she thinks is low for her) she just wants to sleep all the time.  Please call to discuss Patient is scheduled for 10/28

## 2023-01-24 NOTE — Telephone Encounter (Signed)
Called patient and notified her of the following recommendations.   Recommend she cut the metoprolol in half daily, 12.5 daily  Take extra half as needed for breakthrough palpitations/tachycardia   For leg swelling, we do not have baseline echocardiogram to evaluate cardiac function and to determine if swelling is from a cardiac etiology  Would recommend echocardiogram  In the meantime compression hose on the affected leg, continue hydrochlorothiazide   Thx  TGollan   Patient verbalizes understanding.

## 2023-02-13 ENCOUNTER — Ambulatory Visit: Payer: Medicare PPO | Attending: Cardiovascular Disease

## 2023-02-13 DIAGNOSIS — R600 Localized edema: Secondary | ICD-10-CM

## 2023-02-13 DIAGNOSIS — M7989 Other specified soft tissue disorders: Secondary | ICD-10-CM

## 2023-02-13 LAB — ECHOCARDIOGRAM COMPLETE
AR max vel: 1.74 cm2
AV Area VTI: 1.89 cm2
AV Area mean vel: 1.82 cm2
AV Mean grad: 2 mm[Hg]
AV Peak grad: 4.5 mm[Hg]
Ao pk vel: 1.06 m/s
Area-P 1/2: 2.54 cm2
Calc EF: 56.8 %
S' Lateral: 2.3 cm
Single Plane A2C EF: 54.6 %
Single Plane A4C EF: 58 %

## 2023-02-20 NOTE — Progress Notes (Signed)
Cardiology Office Note  Date:  02/21/2023   ID:  Yvette Elliott, DOB 10/30/1935, MRN [id number]  PCP:  Barbette Reichmann, MD   Chief Complaint  Patient presents with   Follow up Echo results    Patient c/o left foot edema, palpitations, racing heart beats and shortness of breath. Medications reviewed by the patient verbally.     HPI:  Yvette Elliott is a 87 year old woman with Past medical history of  diabetes type 2 GAD Hypertension Hyperlipidemia Insomnia COVID in Jan 2022  Minimal aortic atherosclerosis on CT scan 2022,  Who presents for f/u of her cardiac arrhythmia  LOV January 2024 Called and reported having elevated heart rates at night, rate to 100 Primary care recommended she increase metoprolol to tartrate up to 25 mg Then called our office with complaints of fatigue, low heart rate in the 50s Recommendation made to decrease metoprolol down to 12.5  Chronic left foot swelling,  orthedics evaluated  Takes metoprolol tartrate 12.5 mg noon Sees to be working better Before bed, metoprolol will keep her awake  Once in a while SOB, a sensation Has a dog, walks dog Stopped swimming active  Echocardiogram Normal LV and RV size and function No significant valvular heart disease Normal pressures  EKG personally reviewed by myself on todays visit EKG Interpretation Date/Time:  Friday February 21 2023 08:08:54 EDT Ventricular Rate:  90 PR Interval:  158 QRS Duration:  64 QT Interval:  366 QTC Calculation: 447 R Axis:   6  Text Interpretation: Sinus rhythm with occasional Premature ventricular complexes Nonspecific T wave abnormality No previous ECGs available Confirmed by Julien Nordmann [id number]) on 02/21/2023 8:12:47 AM    Zio monitor December 2023,  Normal sinus rhythm Patient had a min HR of 46 bpm, max HR of 174 bpm, and avg HR of 70 bpm 20 Supraventricular Tachycardia runs occurred, the run with the fastest interval lasting 11 beats with a max rate of  174 bpm, the longest lasting 15.7 secs with an avg rate of 126 bpm. (Not patient triggered) Isolated SVEs were occasional (2.6%, 35979), SVE Couplets were rare (<1.0%, 2356), and SVE Triplets were rare (<1.0%, 502).  Isolated VEs were rare (<1.0%, 9978), VE Couplets were rare (<1.0%, 102),  and VE Triplets were rare (<1.0%, 1). Ventricular Bigeminy and Trigeminy were present.  Patient triggered events (12) associated with normal sinus rhythm, rare PAC, PVC   Prior testing reviewed Echo 4/22 NORMAL LEFT VENTRICULAR SYSTOLIC FUNCTION  NORMAL RIGHT VENTRICULAR SYSTOLIC FUNCTION  TRIVIAL REGURGITATION NOTED (See above)  NO VALVULAR STENOSIS   Stress test 4/22: no ischemia  Monitotr 2/22, 6/23  Holter 24 hr June 2023 The holter monitor shows occasional PVCs, PACs, supraventricular tachycardia, sinus tachycardia, and asymptomatic bradycardia with 6% pacs    PMH:   has a past medical history of Colon polyp, Diabetes mellitus without complication (HCC), Hemorrhoids, Hyperlipidemia, Hypertension, and Insomnia.  PSH:    Past Surgical History:  Procedure Laterality Date   APPENDECTOMY     CHOLECYSTECTOMY     COLONOSCOPY  2014   LUNG SURGERY     Removal of scar tissue   TONSILLECTOMY     TOTAL ANKLE ARTHROPLASTY Right     Current Outpatient Medications  Medication Sig Dispense Refill   calcium carbonate (TUMS EX) 750 MG chewable tablet Chew 300 mg by mouth daily as needed.     Cholecalciferol (VITAMIN D) 50 MCG (2000 UT) CAPS Take by mouth daily.     hydrochlorothiazide (HYDRODIURIL)  12.5 MG tablet Take 1 tablet by mouth daily.     HYDROcodone-acetaminophen (NORCO) 7.5-325 MG tablet Take 1 tablet by mouth every 6 (six) hours as needed for moderate pain. Take 1/2 tablet by mouth as needed.     LORazepam (ATIVAN) 0.5 MG tablet Take 0.5 mg by mouth daily as needed.     methimazole (TAPAZOLE) 5 MG tablet Take by mouth daily.     metoprolol tartrate (LOPRESSOR) 25 MG tablet Take 25 mg by  mouth daily.     minoxidil (LONITEN) 2.5 MG tablet Take 1.25 mg by mouth daily.     Multiple Vitamin (MULTI-VITAMINS) TABS Take 1 tablet by mouth daily.     Omega 3 1000 MG CAPS Take by mouth daily.     Potassium 99 MG TABS Take by mouth daily.     No current facility-administered medications for this visit.    Allergies:   Methimazole, Lisinopril, Gemfibrozil, Iodine, Tylenol [acetaminophen], Aleve [naproxen sodium], and Ibuprofen   Social History:  The patient  reports that she has never smoked. She has never used smokeless tobacco. She reports that she does not drink alcohol and does not use drugs.   Family History:   family history includes Breast cancer (age of onset: 69) in her maternal grandmother; Diabetes in her brother, brother, father, maternal grandfather, mother, paternal grandmother, and sister; Leukemia (age of onset: 59) in her sister.    Review of Systems: Review of Systems  Constitutional: Negative.   HENT: Negative.    Respiratory: Negative.    Cardiovascular:  Positive for palpitations.  Gastrointestinal: Negative.   Musculoskeletal: Negative.   Neurological: Negative.   Psychiatric/Behavioral: Negative.    All other systems reviewed and are negative.  PHYSICAL EXAM: VS:  BP 120/82 (BP Location: Left Arm, Patient Position: Sitting, Cuff Size: Normal)   Pulse 90   Ht 5' 1.5" (1.562 m)   Wt 147 lb 4 oz (66.8 kg)   SpO2 96%   BMI 27.37 kg/m  , BMI Body mass index is 27.37 kg/m. Constitutional:  oriented to person, place, and time. No distress.  HENT:  Head: Grossly normal Eyes:  no discharge. No scleral icterus.  Neck: No JVD, no carotid bruits  Cardiovascular: Regular rate and rhythm, no murmurs appreciated Pulmonary/Chest: Clear to auscultation bilaterally, no wheezes or rails Abdominal: Soft.  no distension.  no tenderness.  Musculoskeletal: Normal range of motion Neurological:  normal muscle tone. Coordination normal. No atrophy Skin: Skin warm and  dry Psychiatric: normal affect, pleasant  Recent Labs: No results found for requested labs within last 365 days.    Lipid Panel No results found for: "CHOL", "HDL", "LDLCALC", "TRIG"    Wt Readings from Last 3 Encounters:  02/21/23 147 lb 4 oz (66.8 kg)  05/14/22 141 lb 2 oz (64 kg)  04/01/22 138 lb 4 oz (62.7 kg)     ASSESSMENT AND PLAN:  Problem List Items Addressed This Visit   None Visit Diagnoses     Palpitations    -  Primary   Relevant Orders   EKG 12-Lead (Completed)   Leg swelling       Relevant Orders   EKG 12-Lead (Completed)   Controlled type 2 diabetes mellitus without complication, without long-term current use of insulin (HCC)       GAD (generalized anxiety disorder)       Essential hypertension       Relevant Medications   minoxidil (LONITEN) 2.5 MG tablet   metoprolol tartrate (LOPRESSOR)  25 MG tablet   Other Relevant Orders   EKG 12-Lead (Completed)      Palpitations/tachycardia Prior Zio monitor with no significant arrhythmia, triggered events associated with ectopy.  No atrial fibrillation noted History of general anxiety disorder likely driving symptoms Recommend she continue metoprolol tartrate 12.5 mg daily at noon as she is currently taking If symptoms get worse could do daily dose of metoprolol succinate or bystolic with metoprolol tartrate as needed  Hyperlipidemia Minimal aortic atherosclerosis on CT scan 2022,  Previously mentioned she does not want to be on a statin Total cholesterol 208  Essential hypertension On HCTZ, not on amlodipine Blood pressure is well controlled on today's visit. No changes made to the medications.  Diabetes type 2 A1c 6.3, managed by primary care Numbers trending upward, recent weight gain per our scales   Signed, Dossie Arbour, M.D., Ph.D. [facility] [place], [place] [phone]

## 2023-02-21 ENCOUNTER — Encounter: Payer: Self-pay | Admitting: Cardiovascular Disease

## 2023-02-21 ENCOUNTER — Ambulatory Visit: Payer: Medicare PPO | Attending: Cardiovascular Disease | Admitting: Cardiovascular Disease

## 2023-02-21 VITALS — BP 120/82 | HR 90 | Ht 61.5 in | Wt 147.2 lb

## 2023-02-21 DIAGNOSIS — I10 Essential (primary) hypertension: Secondary | ICD-10-CM

## 2023-02-21 DIAGNOSIS — F411 Generalized anxiety disorder: Secondary | ICD-10-CM

## 2023-02-21 DIAGNOSIS — E119 Type 2 diabetes mellitus without complications: Secondary | ICD-10-CM

## 2023-02-21 DIAGNOSIS — M7989 Other specified soft tissue disorders: Secondary | ICD-10-CM

## 2023-02-21 DIAGNOSIS — R002 Palpitations: Secondary | ICD-10-CM | POA: Diagnosis not present

## 2023-02-21 NOTE — Patient Instructions (Signed)

## 2023-03-10 ENCOUNTER — Ambulatory Visit: Payer: Medicare PPO | Admitting: Cardiovascular Disease

## 2023-03-21 ENCOUNTER — Encounter: Payer: Self-pay | Admitting: Cardiovascular Disease

## 2023-03-24 MED ORDER — METOPROLOL TARTRATE 25 MG PO TABS: ORAL_TABLET | ORAL | 3 refills | Status: AC

## 2023-03-24 MED ORDER — METOPROLOL SUCCINATE ER 50 MG PO TB24: 50.0000 mg | ORAL_TABLET | Freq: Every day | ORAL | 3 refills | Status: DC

## 2023-03-26 ENCOUNTER — Encounter: Payer: Self-pay | Admitting: Cardiovascular Disease

## 2023-03-26 NOTE — Telephone Encounter (Signed)
Called patient, patient states that she was able to pick up the medication from the pharmacy. She states she took the Metoprolol Tartrate 25 mg at around 2:00 PM. Her HR came down to about 90 bpm. She is resending me another EKG strip with updated readings. Patient denies symptoms, no shortness of breath. She states she is not able to check her BP readings, but will get things to check this and will start.   Patient would like to know when she is able to take her Metoprolol Succinate 25 mg tablet, I advised with DOD Dr.End who advised that patient should be okay to take it around bedtime, but if she wanted to wait 12 hours in between she could. Contacted patient, verbalized understanding of this.   Patient will continue to monitor HR at home, and will send [place] an updated EKG strips.

## 2023-04-04 ENCOUNTER — Other Ambulatory Visit: Payer: Self-pay | Admitting: Emergency Medicine

## 2023-04-04 DIAGNOSIS — R002 Palpitations: Secondary | ICD-10-CM

## 2023-04-04 NOTE — Telephone Encounter (Signed)
Called patient and notified her of the following from Dr. Mariah Milling.  Several of the strips she has downloaded do look like atrial fibrillation Would recommend a repeat Zio monitor 2 weeks as the last one did not show A-fib in 2023 Would wait at least 1 hour after taking metoprolol succinate (daily pill) before taking metoprolol tartrate (as needed pill) Thx TGollan   Patient verbalizes understanding. Order placed for Zio monitor.

## 2023-04-05 ENCOUNTER — Ambulatory Visit: Payer: Medicare PPO | Attending: Cardiovascular Disease

## 2023-04-05 DIAGNOSIS — R002 Palpitations: Secondary | ICD-10-CM

## 2023-04-18 DIAGNOSIS — R002 Palpitations: Secondary | ICD-10-CM

## 2023-05-16 ENCOUNTER — Encounter: Payer: Self-pay | Admitting: Cardiovascular Disease

## 2023-05-17 ENCOUNTER — Encounter: Payer: Self-pay | Admitting: Cardiovascular Disease

## 2023-05-19 ENCOUNTER — Other Ambulatory Visit: Payer: Self-pay | Admitting: Emergency Medicine

## 2023-05-19 ENCOUNTER — Encounter: Payer: Self-pay | Admitting: Cardiovascular Disease

## 2023-05-19 ENCOUNTER — Ambulatory Visit: Payer: Medicare PPO | Attending: Cardiovascular Disease | Admitting: Cardiovascular Disease

## 2023-05-19 VITALS — BP 142/80 | HR 65 | Ht 60.0 in | Wt 151.4 lb

## 2023-05-19 DIAGNOSIS — I10 Essential (primary) hypertension: Secondary | ICD-10-CM

## 2023-05-19 DIAGNOSIS — R002 Palpitations: Secondary | ICD-10-CM | POA: Diagnosis not present

## 2023-05-19 MED ORDER — APIXABAN 5 MG PO TABS: 5.0000 mg | ORAL_TABLET | Freq: Two times a day (BID) | ORAL | 3 refills | Status: AC

## 2023-05-19 NOTE — Patient Instructions (Addendum)
 If you start having more atrial fibrillation, Call the office  Medication Instructions:  Please start eliquis  5 mg twice a day  If you need a refill on your cardiac medications before your next appointment, please call your pharmacy.   Lab work: No new labs needed  Testing/Procedures: No new testing needed  Follow-Up: At [facility], you and your health needs are our priority.  As part of our continuing mission to provide you with exceptional heart care, we have created designated Provider Care Teams.  These Care Teams include your primary Cardiologist (physician) and Advanced Practice Providers (APPs -  Physician Assistants and Nurse Practitioners) who all work together to provide you with the care you need, when you need it.  You will need a follow up appointment in 12 months  Providers on your designated Care Team:   Lonni Meager, NP Bernardino Bring, PA-C Cadence [place], [place]  COVID-19 Vaccine Information can be found at: [email or web address] For questions related to vaccine distribution or appointments, please email [email or web address] or call [phone].

## 2023-05-19 NOTE — Progress Notes (Signed)
 Cardiology Office Note  Date:  05/19/2023   ID:  KIMBERLYN QUIOCHO, DOB 1935-09-07, MRN [id number]  PCP:  Sadie Manna, MD   Chief Complaint  Patient presents with   Discuss Zio monitor results.     Patient c/o palpitations & occasional chest discomfort.     HPI:  Ms Danique Hartsough is a 88 year old woman with past medical history of  diabetes type 2 GAD Hypertension Hyperlipidemia Insomnia COVID in Jan 2022  Minimal aortic atherosclerosis on CT scan 2022,  Who presents for f/u of her paroxysmal atrial fibrillation  LOV oct 2024 Active recently, drove to [place] , flew to [place] to see family  Echo 10/24 Normal LV and RV size and function No significant valvular heart disease Normal pressures No clear abnormality to explain leg swelling  Uses Apple Watch to monitor for arrhythmia Episodes of afib, 2% per week Rhythm strips reviewed  Currently taking metoprolol  succinate 50 mg in the morning Takes metoprolol  tartrate as needed Concerned for risk of stroke given paroxysmal atrial fibrillation  EKG personally reviewed by myself on todays visit   EKG Interpretation Date/Time:  Monday May 19 2023 16:46:26 EST Ventricular Rate:  65 PR Interval:  166 QRS Duration:  70 QT Interval:  434 QTC Calculation: 451 R Axis:   21  Text Interpretation: Normal sinus rhythm Normal ECG When compared with ECG of 21-Feb-2023 08:08, Premature ventricular complexes are no longer Present Confirmed by Perla Lye [phone]) on 05/19/2023 5:22:28 PM    Zio monitor December 2023,  Normal sinus rhythm Patient had a min HR of 46 bpm, max HR of 174 bpm, and avg HR of 70 bpm 20 Supraventricular Tachycardia runs occurred, the run with the fastest interval lasting 11 beats with a max rate of 174 bpm, the longest lasting 15.7 secs with an avg rate of 126 bpm. (Not patient triggered) Isolated SVEs were occasional (2.6%, 35979), SVE Couplets were rare (<1.0%, 2356), and SVE Triplets were  rare (<1.0%, 502).  Isolated VEs were rare (<1.0%, 9978), VE Couplets were rare (<1.0%, 102),  and VE Triplets were rare (<1.0%, 1). Ventricular Bigeminy and Trigeminy were present.  Patient triggered events (12) associated with normal sinus rhythm, rare PAC, PVC  Prior testing reviewed Echo 4/22 NORMAL LEFT VENTRICULAR SYSTOLIC FUNCTION  NORMAL RIGHT VENTRICULAR SYSTOLIC FUNCTION  TRIVIAL REGURGITATION NOTED (See above)  NO VALVULAR STENOSIS   Stress test 4/22: no ischemia  Monitotr 2/22, 6/23  Holter 24 hr June 2023 The holter monitor shows occasional PVCs, PACs, supraventricular tachycardia, sinus tachycardia, and asymptomatic bradycardia with 6% pacs    PMH:   has a past medical history of Colon polyp, Diabetes mellitus without complication (HCC), Hemorrhoids, Hyperlipidemia, Hypertension, and Insomnia.  PSH:    Past Surgical History:  Procedure Laterality Date   APPENDECTOMY     CHOLECYSTECTOMY     COLONOSCOPY  2014   LUNG SURGERY     Removal of scar tissue   TONSILLECTOMY     TOTAL ANKLE ARTHROPLASTY Right     Current Outpatient Medications  Medication Sig Dispense Refill   calcium carbonate (TUMS EX) 750 MG chewable tablet Chew 300 mg by mouth daily as needed.     Cholecalciferol (VITAMIN D) 50 MCG (2000 UT) CAPS Take by mouth daily.     hydrochlorothiazide (HYDRODIURIL) 12.5 MG tablet Take 1 tablet by mouth daily.     HYDROcodone-acetaminophen (NORCO) 7.5-325 MG tablet Take 1 tablet by mouth every 6 (six) hours as needed for moderate pain.  Take 1/2 tablet by mouth as needed.     LORazepam (ATIVAN) 0.5 MG tablet Take 0.5 mg by mouth daily as needed.     methimazole (TAPAZOLE) 5 MG tablet Take by mouth daily.     metoprolol  succinate (TOPROL -XL) 50 MG 24 hr tablet Take 1 tablet (50 mg total) by mouth daily. Take with or immediately following a meal. 90 tablet 3   metoprolol  tartrate (LOPRESSOR ) 25 MG tablet Take 1 tablet twice daily as needed for breakthrough  palpitations or heart rate above 100. 180 tablet 3   minoxidil (LONITEN) 2.5 MG tablet Take 1.25 mg by mouth daily.     Multiple Vitamin (MULTI-VITAMINS) TABS Take 1 tablet by mouth daily.     Omega 3 1000 MG CAPS Take by mouth daily.     Potassium 99 MG TABS Take by mouth daily.     apixaban  (ELIQUIS ) 5 MG TABS tablet Take 1 tablet (5 mg total) by mouth 2 (two) times daily. (Patient not taking: Reported on 05/19/2023) 180 tablet 3   No current facility-administered medications for this visit.    Allergies:   Methimazole, Lisinopril, Gemfibrozil, Iodine, Tylenol [acetaminophen], Aleve [naproxen sodium], and Ibuprofen   Social History:  The patient  reports that she has never smoked. She has never used smokeless tobacco. She reports that she does not drink alcohol and does not use drugs.   Family History:   family history includes Breast cancer (age of onset: 20) in her maternal grandmother; Diabetes in her brother, brother, father, maternal grandfather, mother, paternal grandmother, and sister; Leukemia (age of onset: 24) in her sister.    Review of Systems: Review of Systems  Constitutional: Negative.   HENT: Negative.    Respiratory: Negative.    Cardiovascular:  Positive for palpitations.  Gastrointestinal: Negative.   Musculoskeletal: Negative.   Neurological: Negative.   Psychiatric/Behavioral: Negative.    All other systems reviewed and are negative.  PHYSICAL EXAM: VS:  BP (!) 142/80 (BP Location: Left Arm, Patient Position: Sitting, Cuff Size: Normal)   Pulse 65   Ht 5' (1.524 m)   Wt 151 lb 6 oz (68.7 kg)   SpO2 97%   BMI 29.56 kg/m  , BMI Body mass index is 29.56 kg/m. Constitutional:  oriented to person, place, and time. No distress.  HENT:  Head: Grossly normal Eyes:  no discharge. No scleral icterus.  Neck: No JVD, no carotid bruits  Cardiovascular: Regular rate and rhythm, no murmurs appreciated Pulmonary/Chest: Clear to auscultation bilaterally, no wheezes or  rails Abdominal: Soft.  no distension.  no tenderness.  Musculoskeletal: Normal range of motion Neurological:  normal muscle tone. Coordination normal. No atrophy Skin: Skin warm and dry Psychiatric: normal affect, pleasant  Recent Labs: No results found for requested labs within last 365 days.    Lipid Panel No results found for: CHOL, HDL, LDLCALC, TRIG    Wt Readings from Last 3 Encounters:  05/19/23 151 lb 6 oz (68.7 kg)  02/21/23 147 lb 4 oz (66.8 kg)  05/14/22 141 lb 2 oz (64 kg)     ASSESSMENT AND PLAN:  Problem List Items Addressed This Visit   None Visit Diagnoses       Palpitations    -  Primary   Relevant Orders   EKG 12-Lead (Completed)     Essential hypertension       Relevant Orders   EKG 12-Lead (Completed)      Paroxysmal atrial fibrillation Documented on her Apple Watch, rhythm  strips reviewed Recommended she start Eliquis  5 twice daily, continue metoprolol  succinate 50 in the morning, take metoprolol  tartrate as needed for breakthrough arrhythmia If A-fib burden increases, could start flecainide 50 twice daily  Hyperlipidemia Minimal aortic atherosclerosis on CT scan 2022,  Previously mentioned she does not want to be on a statin Total cholesterol >200  Essential hypertension On HCTZ and metoprolol  succinate not on amlodipine Blood pressure is at the high end of her range, no changes made to the medications.  Diabetes type 2 We have encouraged continued exercise, careful diet management  A1c in the 6 range   Signed, Velinda Lunger, M.D., Ph.D. [facility] [place], [place] [phone]

## 2023-05-20 NOTE — Telephone Encounter (Signed)
 Patient was seen in clinic by Dr. Mariah Milling 05/19/23 and all questions were answered.

## 2023-08-25 ENCOUNTER — Other Ambulatory Visit (INDEPENDENT_AMBULATORY_CARE_PROVIDER_SITE_OTHER): Payer: Self-pay | Admitting: Nurse Practitioner

## 2023-08-25 DIAGNOSIS — I83892 Varicose veins of left lower extremities with other complications: Secondary | ICD-10-CM

## 2023-08-28 ENCOUNTER — Encounter (INDEPENDENT_AMBULATORY_CARE_PROVIDER_SITE_OTHER): Payer: Self-pay | Admitting: Nurse Practitioner

## 2023-08-28 ENCOUNTER — Ambulatory Visit (INDEPENDENT_AMBULATORY_CARE_PROVIDER_SITE_OTHER): Payer: Self-pay

## 2023-08-28 DIAGNOSIS — I83892 Varicose veins of left lower extremities with other complications: Secondary | ICD-10-CM | POA: Diagnosis not present

## 2023-09-01 ENCOUNTER — Ambulatory Visit (INDEPENDENT_AMBULATORY_CARE_PROVIDER_SITE_OTHER): Payer: Self-pay | Admitting: Vascular Surgery

## 2023-09-01 ENCOUNTER — Encounter (INDEPENDENT_AMBULATORY_CARE_PROVIDER_SITE_OTHER): Payer: Self-pay | Admitting: Vascular Surgery

## 2023-09-01 VITALS — BP 136/81 | HR 71 | Resp 18 | Ht 61.5 in | Wt 148.8 lb

## 2023-09-01 DIAGNOSIS — I8310 Varicose veins of unspecified lower extremity with inflammation: Secondary | ICD-10-CM | POA: Insufficient documentation

## 2023-09-01 NOTE — Progress Notes (Signed)
 MRN : [id number]  Yvette Elliott is a 88 y.o. (10/03/1935) female who presents with chief complaint of varicose veins hurt.  History of Present Illness:   The patient is seen for evaluation of varicose veins of the left lower extremity.  She notes that these veins are in the calf area on the left.  The veins have been tender particularly to palpation.  She is also concerned that they are associated with an increase in swelling of the ankle itself and in association with this recent increase in ankle swelling the veins have become less prominent.    There is no history of DVT, PE or superficial thrombophlebitis. There is no history of ulceration or hemorrhage.  The patient has not worn graduated compression in the past. At the present time the patient has not been using over-the-counter analgesics. There is no history of prior surgical intervention or sclerotherapy.  Duplex ultrasound of the left lower extremity venous system dated August 28, 2023 demonstrates a normal deep venous system there is trivial reflux in the common femoral vein there is no evidence of saphenous reflux.  Multiple varicosities are noted in the proximal calf area.   Current Meds  Medication Sig   apixaban  (ELIQUIS ) 5 MG TABS tablet Take 1 tablet (5 mg total) by mouth 2 (two) times daily.   calcium carbonate (TUMS EX) 750 MG chewable tablet Chew 300 mg by mouth daily as needed.   hydrochlorothiazide (HYDRODIURIL) 12.5 MG tablet Take 1 tablet by mouth daily.   HYDROcodone-acetaminophen (NORCO) 7.5-325 MG tablet Take 1 tablet by mouth every 6 (six) hours as needed for moderate pain. Take 1/2 tablet by mouth as needed.   LORazepam (ATIVAN) 0.5 MG tablet Take 0.5 mg by mouth daily as needed.   methimazole (TAPAZOLE) 5 MG tablet Take by mouth daily.   metoprolol  tartrate (LOPRESSOR ) 25 MG tablet Take 1 tablet twice daily as needed for breakthrough palpitations or heart rate above 100.   minoxidil (LONITEN)  2.5 MG tablet Take 1.25 mg by mouth daily.   Multiple Vitamin (MULTI-VITAMINS) TABS Take 1 tablet by mouth daily.   Omega 3 1000 MG CAPS Take by mouth daily.   Potassium 99 MG TABS Take by mouth daily.    Past Medical History:  Diagnosis Date   Colon polyp    Diabetes mellitus without complication (HCC)    Hemorrhoids    Hyperlipidemia    Hypertension    Insomnia     Past Surgical History:  Procedure Laterality Date   APPENDECTOMY     CHOLECYSTECTOMY     COLONOSCOPY  2014   LUNG SURGERY     Removal of scar tissue   TONSILLECTOMY     TOTAL ANKLE ARTHROPLASTY Right     Social History Social History   Tobacco Use   Smoking status: Never   Smokeless tobacco: Never  Vaping Use   Vaping status: Never Used  Substance Use Topics   Alcohol use: No    Alcohol/week: 0.0 standard drinks of alcohol   Drug use: No    Family History Family History  Problem Relation Age of Onset   Leukemia Sister 61   Diabetes Sister    Diabetes Mother    Diabetes Father    Diabetes Brother    Diabetes Maternal Grandfather    Diabetes Paternal Grandmother    Diabetes Brother    Breast cancer Maternal Grandmother 49  Allergies  Allergen Reactions   Methimazole Other (See Comments)   Lisinopril Swelling    In mouth   Gemfibrozil     Questionable   Iodine Hives and Itching    IVP dye   Tylenol [Acetaminophen]     Issue with liver   Aleve [Naproxen Sodium] Rash   Ibuprofen Rash    States she can take advil     REVIEW OF SYSTEMS (Negative unless checked)  Constitutional: [] Weight loss  [] Fever  [] Chills Cardiac: [] Chest pain   [] Chest pressure   [] Palpitations   [] Shortness of breath when laying flat   [] Shortness of breath with exertion. Vascular:  [] Pain in legs with walking   [x] Pain in legs with standing  [] History of DVT   [] Phlebitis   [] Swelling in legs   [x] Varicose veins   [] Non-healing ulcers Pulmonary:   [] Uses home oxygen   [] Productive cough   [] Hemoptysis    [] Wheeze  [] COPD   [] Asthma Neurologic:  [] Dizziness   [] Seizures   [] History of stroke   [] History of TIA  [] Aphasia   [] Vissual changes   [] Weakness or numbness in arm   [] Weakness or numbness in leg Musculoskeletal:   [] Joint swelling   [] Joint pain   [] Low back pain Hematologic:  [] Easy bruising  [] Easy bleeding   [] Hypercoagulable state   [] Anemic Gastrointestinal:  [] Diarrhea   [] Vomiting  [] Gastroesophageal reflux/heartburn   [] Difficulty swallowing. Genitourinary:  [] Chronic kidney disease   [] Difficult urination  [] Frequent urination   [] Blood in urine Skin:  [] Rashes   [] Ulcers  Psychological:  [] History of anxiety   []  History of major depression.  Physical Examination  Vitals:   09/01/23 1309  BP: 136/81  Pulse: 71  Resp: 18  Weight: 148 lb 12.8 oz (67.5 kg)  Height: 5' 1.5" (1.562 m)   Body mass index is 27.66 kg/m. Gen: WD/WN, NAD Head: [place]/AT, No temporalis wasting.  Ear/Nose/Throat: Hearing grossly intact, nares w/o erythema or drainage, pinna without lesions Eyes: PER, EOMI, sclera nonicteric.  Neck: Supple, no gross masses.  No JVD.  Pulmonary:  Good air movement, no audible wheezing, no use of accessory muscles.  Cardiac: RRR, precordium not hyperdynamic. Vascular: Scattered varicosities present, greater than 6 mm left calf.  Veins are not tender to palpation  Mild venous stasis changes to the legs bilaterally.  Trace soft pitting edema bilaterally with moderate edema of the ankles area itself CEAP C3sEpAsPr Vessel Right Left  Radial Palpable Palpable  Gastrointestinal: soft, non-distended. No guarding/no peritoneal signs.  Musculoskeletal: M/S 5/5 throughout.  No deformity.  Neurologic: CN 2-12 intact. Pain and light touch intact in extremities.  Symmetrical.  Speech is fluent. Motor exam as listed above. Psychiatric: Judgment intact, Mood & affect appropriate for pt's clinical situation. Dermatologic: Venous rashes no ulcers noted.  No changes consistent with  cellulitis. Lymph : No lichenification or skin changes of chronic lymphedema.  CBC No results found for: "WBC", "HGB", "HCT", "MCV", "PLT"  BMET    Component Value Date/Time   CREATININE 0.60 02/19/2021 1514   CrCl cannot be calculated (Patient's most recent lab result is older than the maximum 21 days allowed.).  COAG No results found for: "INR", "PROTIME"  Radiology No results found.   Assessment/Plan 1. Varicose veins with inflammation (Primary) Recommend:  The patient is complaining of varicose veins in association with some edema of the ankle.  I do not believe that the ankle edema represents a change directly related to varicose veins I do not believe  that injection sclerotherapy will change the ankle edema significantly.  It would appear this is more related to some inflammatory changes of the ankle joint itself.  I have had a discussion with the patient regarding  varicose veins and why they can cause symptoms.  Patient will begin wearing graduated compression stockings on a daily basis, beginning first thing in the morning and removing them in the evening. The patient is instructed specifically not to sleep in the stockings.    In addition, behavioral modification including elevation during the day will be initiated, utilizing a recliner was recommended.  The patient is also instructed to continue exercising such as walking 4-5 times per week.  At this time the patient wishes to continue conservative therapy and more invasive treatments such as laser ablation are not indicated.  The Patient will follow up PRN if the symptoms worsen.    Devon Fogo, MD  09/01/2023 1:13 PM

## 2023-09-30 ENCOUNTER — Encounter (INDEPENDENT_AMBULATORY_CARE_PROVIDER_SITE_OTHER): Payer: Self-pay

## 2024-04-13 ENCOUNTER — Telehealth: Payer: Self-pay | Admitting: Cardiovascular Disease

## 2024-04-13 NOTE — Telephone Encounter (Signed)
 Spoke with patient regarding symptoms - hx of palpitations, elevated HR > 140 patient took lopressor  25 mg around 11 am, HR dropped back to regular per patient - advised per RX that if HR increased again that she could take the second lopressor , patient denied palp with elevated HR   Patient last OV 05/19/2023 with Gollan -   OV scheduled with DW for 04/14/24 at 1:30

## 2024-04-13 NOTE — Telephone Encounter (Signed)
 STAT if HR is under 50 or over 120  (normal HR is 60-100 beats per minute)  What is your heart rate? 144 this morning and now it's 103  Do you have a log of your heart rate readings (document readings)? No  Do you have any other symptoms? No

## 2024-04-13 NOTE — Progress Notes (Unsigned)
 Cardiology Clinic Note   Date: 04/14/2024 ID: Yvette Elliott, DOB [birth year], MRN [id number]  Primary Cardiologist:  Evalene Lunger, MD  Chief Complaint   Yvette Elliott is a 88 y.o. female who presents to the clinic today for evaluation of elevated heart rate.   Patient Profile   Yvette Elliott is followed by Dr. Gollan for the history outlined below.      Past medical history significant for: PAF. Echo 02/13/2023: EF 60 to 65%.  No RWMA.  Mild LVH.  Grade I DD.  Normal RV size/function.  Normal PA pressure, RVSP 29.2 mmHg.  Mild MR. 14-day ZIO 05/08/2023: HR 49 to 193 bpm, average 67 bpm.  10 runs of SVT fastest 8 beats max rate 193 bpm, longest 11 beats average rate 132 bpm.  A-fib occurred <1% HR 94 to 153 bpm, average 124 bpm longest lasting 14 minutes 14 seconds average rate 123 bpm.  Occasional PACs, rare PVCs. Hypertension. Hyperlipidemia. Lipid panel 01/21/2024: LDL 114, HDL 35, TG 254, total 200. T2DM. GAD.  In summary, patient was previously followed by Dr. Hester at [facility] cardiology.  She establish care with Dr. Gollan on 04/01/2022.  She reported a year-long history of tachypalpitations with heart rate getting 220 bpm while at rest.  Apple watch demonstrated low burden of A-fib.  14-day ZIO in December 2023 demonstrated HR 46 to 174 bpm, average 70 bpm, 20 runs of SVT fastest 11 beats max rate 174 bpm, longest 15.7 seconds average rate 126 bpm occasional PACs, rare PVCs.  Echo in October 2024 showed normal LV/RV function as detailed above.  Repeat cardiac monitor December 2024 showed <1% A-fib burden with runs of SVT as detailed above.  He was recommended to be started on Eliquis .  Patient was last seen in the office by Dr. Gollan on 05/19/2023 for routine follow-up.  No medication changes were made.  Patient contacted the office on 04/13/2024 with complaints of elevated heart rate.  Per triage RN: Spoke with patient regarding symptoms - hx of palpitations, elevated HR >  140 patient took lopressor  25 mg around 11 am, HR dropped back to regular per patient - advised per RX that if HR increased again that she could take the second lopressor , patient denied palp with elevated HR.  She was scheduled for office evaluation.     History of Present Illness    Today, patient reports one episode of elevated heart rate one day ago. She states she woke not feeling herself stating that she did not want to do her usual Tuesday plans of having lunch and playing Mahjong. She then noted that the ringing in her ears, which is normal for her, became very loud. When she checked her heart rate it was 146 bpm. She did not feel her heart was racing or skipping. About 30 minutes after noting her elevated heart rate she took lopressor  25 mg. Heart rate came down to <100 bpm about 45+ minutes later. She states since that time she has not noticed her heart rate back into the 140s but she feels her heart rate has not gone back to her normal rate of 60. She has not taken another dose of the lopressor , as her heart rate has stayed under 100 bpm. She denies chest pain or shortness of breath associated with episode. Today she is back to feeling more normal but still feels slightly off. She has not been ill recently. She hydrates well throughout the day and limits her caffeine intake.  She denies lower extremity edema, orthopnea or PND.     ROS: All other systems reviewed and are otherwise negative except as noted in History of Present Illness.  EKGs/Labs Reviewed    EKG Interpretation Date/Time:  Wednesday April 14 2024 13:47:07 EST Ventricular Rate:  102 PR Interval:  152 QRS Duration:  64 QT Interval:  388 QTC Calculation: 505 R Axis:   -1  Text Interpretation: Sinus tachycardia with occasional Premature ventricular complexes Rare PACs Confirmed by Loistine Sober [phone]) on 04/14/2024 1:52:23 PM    Risk Assessment/Calculations     CHA2DS2-VASc Score = 5   This indicates a  7.2% annual risk of stroke. The patient's score is based upon: CHF History: 0 HTN History: 1 Diabetes History: 1 Stroke History: 0 Vascular Disease History: 0 Age Score: 2 Gender Score: 1             Physical Exam    VS:  BP 100/70 (BP Location: Left Arm, Patient Position: Sitting, Cuff Size: Normal)   Pulse (!) 102   Ht 5' 1 (1.549 m)   Wt 144 lb 6 oz (65.5 kg)   SpO2 96%   BMI 27.28 kg/m  , BMI Body mass index is 27.28 kg/m.  GEN: Well nourished, well developed, in no acute distress. Neck: No JVD or carotid bruits. Cardiac:  RRR. Extrasystole. No murmur. No rubs or gallops.   Respiratory:  Respirations regular and unlabored. Clear to auscultation without rales, wheezing or rhonchi. GI: Soft, nontender, nondistended. Extremities: Radials/DP/PT 2+ and equal bilaterally. No clubbing or cyanosis. No edema   Skin: Warm and dry, no rash. Neuro: Strength intact.  Assessment & Plan   PAF Echo October 2024 demonstrated normal LV/RV function, Grade I DD, normal PA pressure, mild MR.  14-day ZIO December 2024 demonstrated 10 runs of SVT and <1% A-fib burden longest episode 14 minutes 14 seconds average rate 123 bpm.  Denies spontaneous bleeding concerns.  Patient reported 1 episode of elevated heart rate to 146 bpm one day ago. She woke feeling off to the point she did not want to do her normal activities. The ringing in her ears she normally has became very loud so she checked her heart rate and noted it was elevated. She took a dose of as needed lopressor  and heart rate came down to <100 bpm after about 45 minutes. She has not had a similar episode since but does feel her heart rate is higher than her usual 60 bpm. She had no sensation of palpitations during episode. She is feeling better today but still not completely herself. EKG today demonstrates sinus tachycardia with occasional PVCs and rare PACs.  - Continue Eliquis , Toprol , as needed Lopressor . - CBC, CMP, TSH, Mg today.   - 14 day Zio.   Hypertension BP today 100/70. No report of lightheadedness, dizziness, presyncope or syncope.  - Continue HCTZ, Toprol .  Disposition: CBC, BMP, TSH, Mg today. 14 day Zio. Return in 6-8 weeks or sooner as needed.          Signed, Sober HERO. Denette Hass, DNP, NP-C

## 2024-04-14 ENCOUNTER — Other Ambulatory Visit: Payer: Self-pay | Admitting: Cardiovascular Disease

## 2024-04-14 ENCOUNTER — Ambulatory Visit

## 2024-04-14 ENCOUNTER — Ambulatory Visit: Attending: Student | Admitting: Student

## 2024-04-14 ENCOUNTER — Encounter: Payer: Self-pay | Admitting: Student

## 2024-04-14 VITALS — BP 100/70 | HR 102 | Ht 61.0 in | Wt 144.4 lb

## 2024-04-14 DIAGNOSIS — I480 Paroxysmal atrial fibrillation: Secondary | ICD-10-CM

## 2024-04-14 DIAGNOSIS — I10 Essential (primary) hypertension: Secondary | ICD-10-CM

## 2024-04-14 DIAGNOSIS — Z79899 Other long term (current) drug therapy: Secondary | ICD-10-CM

## 2024-04-14 DIAGNOSIS — R000 Tachycardia, unspecified: Secondary | ICD-10-CM

## 2024-04-14 NOTE — Patient Instructions (Signed)
 Medication Instructions:   Your physician recommends that you continue on your current medications as directed. Please refer to the Current Medication list given to you today.    *If you need a refill on your cardiac medications before your next appointment, please call your pharmacy*  Lab Work:  Your provider would like for you to have following labs drawn today BMet, CBC, Magnesium, TSH.    If you have labs (blood work) drawn today and your tests are completely normal, you will receive your results only by:  MyChart Message (if you have MyChart) OR  A paper copy in the mail If you have any lab test that is abnormal or we need to change your treatment, we will call you to review the results.  Testing/Procedures:  GEOFFRY HEWS- Long Term Monitor Instructions  Your physician has requested you wear a ZIO patch monitor for 14 days.  This is a single patch monitor. [employer] supplies one patch monitor per enrollment. Additional stickers are not available. Please do not apply patch if you will be having a Nuclear Stress Test, Echocardiogram, Cardiac CT, MRI, or Chest Xray during the period you would be wearing the monitor. The patch cannot be worn during these tests. You cannot remove and re-apply the ZIO XT patch monitor.  Your ZIO patch monitor will be mailed 3 day USPS to your address on file. It may take 3-5 days to receive your monitor after you have been enrolled. Once you have received your monitor, please review the enclosed instructions. Your monitor has already been registered assigning a specific monitor serial number to you.  Billing and Patient Assistance Program Information  We have supplied [employer] with any of your insurance information on file for billing purposes.  [employer] offers a sliding scale Patient Assistance Program for patients that do not have insurance, or whose insurance does not completely cover the cost of the ZIO monitor.  You must apply for the Patient Assistance Program to  qualify for this discounted rate.  To apply, please call [employer] at [phone], select option 4, select option 2, ask to apply for Patient Assistance Program. Meredeth will ask your household income, and how many people are in your household. They will quote your out-of-pocket cost based on that information. [employer] will also be able to set up a 41-month, interest-free payment plan if needed.  Applying the monitor   Shave hair from upper left chest.  Hold abrader disc by orange tab. Rub abrader in 40 strokes over the upper left chest as indicated in your monitor instructions.  Clean area with 4 enclosed alcohol pads. Let dry.  Apply patch as indicated in monitor instructions. Patch will be placed under collarbone on left side of chest with arrow pointing upward.  Rub patch adhesive wings for 2 minutes. Remove white label marked 1. Remove the white label marked 2. Rub patch adhesive wings for 2 additional minutes.  While looking in a mirror, press and release button in center of patch. A small green light will flash 3-4 times. This will be your only indicator that the monitor has been turned on.  Do not shower for the first 24 hours. You may shower after the first 24 hours.  Press the button if you feel a symptom. You will hear a small click. Record Date, Time and Symptom in the Patient Logbook.  When you are ready to remove the patch, follow instructions on the last 2 pages of Patient Logbook.  Stick patch monitor into the tabs  at the bottom of the return box.  Place Patient Logbook in the blue and white box. Use locking tab on box and tape box closed securely. The blue and white box has prepaid postage on it. Please place it in the mailbox as soon as possible. Your physician should have your test results approximately 7-14 days after the monitor has been mailed back to [facility].  Call [facility] Customer Care at [phone] if you have questions regarding your ZIO XT patch  monitor.  Call them immediately if you see an orange light blinking on your monitor.  If your monitor falls off in less than 4 days, contact our Monitor department at [phone].  If your monitor becomes loose or falls off after 4 days call [employer] at [phone] for suggestions on securing your monitor.   Referrals:  None ordered at this time   Follow-Up:  At [facility], you and your health needs are our priority.  As part of our continuing mission to provide you with exceptional heart care, our providers are all part of one team.  This team includes your primary Cardiologist (physician) and Advanced Practice Providers or APPs (Physician Assistants and Nurse Practitioners) who all work together to provide you with the care you need, when you need it.  Your next appointment:   6 - 8 week(s)  Provider:    Barnie Hila, NP    We recommend signing up for the patient portal called MyChart.  Sign up information is provided on this After Visit Summary.  MyChart is used to connect with patients for Virtual Visits (Telemedicine).  Patients are able to view lab/test results, encounter notes, upcoming appointments, etc.  Non-urgent messages can be sent to your provider as well.   To learn more about what you can do with MyChart, go to [email or web address].

## 2024-04-15 ENCOUNTER — Ambulatory Visit: Payer: Self-pay | Admitting: Student

## 2024-04-15 LAB — BASIC METABOLIC PANEL WITH GFR
BUN/Creatinine Ratio: 11 — ABNORMAL LOW (ref 12–28)
BUN: 8 mg/dL (ref 8–27)
CO2: 24 mmol/L (ref 20–29)
Calcium: 9.9 mg/dL (ref 8.7–10.3)
Chloride: 98 mmol/L (ref 96–106)
Creatinine, Ser: 0.73 mg/dL (ref 0.57–1.00)
Glucose: 191 mg/dL — ABNORMAL HIGH (ref 70–99)
Potassium: 3.7 mmol/L (ref 3.5–5.2)
Sodium: 139 mmol/L (ref 134–144)
eGFR: 80 mL/min/1.73 (ref >59)

## 2024-04-15 LAB — MAGNESIUM: Magnesium: 1.7 mg/dL (ref 1.6–2.3)

## 2024-04-15 LAB — CBC
Hematocrit: 43 % (ref 34.0–46.6)
Hemoglobin: 14.2 g/dL (ref 11.1–15.9)
MCH: 31.1 pg (ref 26.6–33.0)
MCHC: 33 g/dL (ref 31.5–35.7)
MCV: 94 fL (ref 79–97)
Platelets: 343 x10E3/uL (ref 150–450)
RBC: 4.57 x10E6/uL (ref 3.77–5.28)
RDW: 13.1 % (ref 11.7–15.4)
WBC: 6.4 x10E3/uL (ref 3.4–10.8)

## 2024-04-15 LAB — TSH: TSH: 3.27 u[IU]/mL (ref 0.450–4.500)

## 2024-04-15 MED ORDER — METOPROLOL SUCCINATE ER 50 MG PO TB24: 50.0000 mg | ORAL_TABLET | Freq: Every day | ORAL | 3 refills | Status: AC

## 2024-04-15 NOTE — Progress Notes (Signed)
 Last read by Altamese JINNY Crosby LOU at 8:08AM on 04/15/2024.

## 2024-04-16 ENCOUNTER — Telehealth: Payer: Self-pay | Admitting: Emergency Medicine

## 2024-04-16 NOTE — Telephone Encounter (Signed)
 Spoke with the patient when she returned my earlier call.  Inquired about if the patient wanted to wear the monitor that was on it way and scheduled to be delivered on Monday, 04/19/24.  Patient stated that she did want to go ahead and wear to find out if there is anything else that could be going on with her heart.  She also stated that she started taking her medication again last night and almost immediately started feeling much better.  Patient states not taking the medication for approximately 2 weeks.  Informed the patient that not taking the medication could very well may have been the cause of her syncopal episode.  Also informed her that Barnie Hila, NP was ok with cancelling the order for the monitor and having it resent if the patient had another syncopal episode.  Patient stated that she would like to go ahead and wear the 2 week monitor and get the additional information that it will provide.  Patient expressed gratitude for the call, with all questions and concerns addressed at this time.  Order for Zio kept as is with no changes made at this time.

## 2024-04-16 NOTE — Telephone Encounter (Signed)
 Patient returned RN's call.

## 2024-04-16 NOTE — Telephone Encounter (Signed)
Called and left message for the patient to call back.

## 2024-05-11 DIAGNOSIS — I480 Paroxysmal atrial fibrillation: Secondary | ICD-10-CM | POA: Diagnosis not present

## 2024-05-12 NOTE — Telephone Encounter (Signed)
-----   Message from Barnie Hila, NP sent at 05/12/2024 10:04 AM EST ----- Please let patient know heart monitor showed 5 runs of fast heart rate lasting up to 17 beats with max rate 167 bpm. No afib detected. Please ask patient if she has had any more episodes of feeling  unwell. She is welcome to keep her follow up on 1/16. If she feels she is doing well since getting back on Toprol  we can push that out to 6 months.   Thank you!  DW

## 2024-05-12 NOTE — Telephone Encounter (Addendum)
 Called and spoke with the patient.  Patient states feeling better since being on Toprol .  Asked the patient if she wanted to keep the appointment on 05/28/24 or was she comfortable with pushing that back 6 months.  Patient ok with cancelling the appointment on the 16th and having recall placed for 6 months.  Patient expressed gratitude for the call.  All questions and concerns addressed at this time.  Appointment for the 16th cancelled.  Message sent to Scheduling to put in for a 6 month follow-up recall placed.

## 2024-05-28 ENCOUNTER — Ambulatory Visit: Admitting: Student
